# Patient Record
Sex: Male | Born: 1990 | Race: Black or African American | Hispanic: No | Marital: Single | Smoking: Current some day smoker
Health system: Southern US, Community
[De-identification: ages and names within clinical notes are randomized; demographics above are authoritative.]

---
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
## 2013-12-29 ENCOUNTER — Emergency Department: Payer: Self-pay | Admitting: Internal Medicine

## 2014-08-29 ENCOUNTER — Emergency Department: Payer: Self-pay | Admitting: Emergency Medicine

## 2015-05-22 ENCOUNTER — Encounter (HOSPITAL_COMMUNITY): Payer: Self-pay | Admitting: *Deleted

## 2015-05-22 ENCOUNTER — Emergency Department (HOSPITAL_COMMUNITY)
Admission: EM | Admit: 2015-05-22 | Discharge: 2015-05-22 | Disposition: A | Discharge status: Home / Self Care | Payer: Self-pay | Attending: Emergency Medicine | Admitting: Emergency Medicine

## 2015-05-22 DIAGNOSIS — F17210 Nicotine dependence, cigarettes, uncomplicated: Secondary | ICD-10-CM | POA: Insufficient documentation

## 2015-05-22 DIAGNOSIS — K0889 Other specified disorders of teeth and supporting structures: Secondary | ICD-10-CM | POA: Insufficient documentation

## 2015-05-22 DIAGNOSIS — K08109 Complete loss of teeth, unspecified cause, unspecified class: Secondary | ICD-10-CM | POA: Insufficient documentation

## 2015-05-22 DIAGNOSIS — K029 Dental caries, unspecified: Secondary | ICD-10-CM | POA: Insufficient documentation

## 2015-05-22 MED ORDER — PENICILLIN V POTASSIUM 500 MG PO TABS: 500.0000 mg | ORAL_TABLET | Freq: Four times a day (QID) | ORAL | Status: AC

## 2015-05-22 MED ORDER — IBUPROFEN 800 MG PO TABS: 800.0000 mg | ORAL_TABLET | Freq: Three times a day (TID) | ORAL | Status: DC

## 2015-05-22 MED ORDER — IBUPROFEN 400 MG PO TABS
800.0000 mg | ORAL_TABLET | Freq: Once | ORAL | Status: AC
  Administered 2015-05-22: 800 mg via ORAL
  Filled 2015-05-22: qty 2

## 2015-05-22 NOTE — ED Notes (Signed)
Pt reports oral swelling started 2 days ago. PT reports he has a broken tooth at site .

## 2015-05-22 NOTE — ED Notes (Signed)
Declined W/C at D/C and was escorted to lobby by RN. 

## 2015-05-22 NOTE — Discharge Instructions (Signed)
Dental Pain    Dental pain may be caused by many things, including:  Tooth decay (cavities or caries). Cavities expose the nerve of your tooth to air and hot or cold temperatures. This can cause pain or discomfort.  Abscess or infection. A dental abscess is a collection of infected pus from a bacterial infection in the inner part of the tooth (pulp). It usually occurs at the end of the tooth's root.  Injury.  An unknown reason (idiopathic). Your pain may be mild or severe. It may only occur when:  You are chewing.  You are exposed to hot or cold temperature.  You are eating or drinking sugary foods or beverages, such as soda or candy. Your pain may also be constant.  HOME CARE INSTRUCTIONS  Watch your dental pain for any changes. The following actions may help to lessen any discomfort that you are feeling:  Take medicines only as directed by your dentist.  If you were prescribed an antibiotic medicine, finish all of it even if you start to feel better.  Keep all follow-up visits as directed by your dentist. This is important.  Do not apply heat to the outside of your face.  Rinse your mouth or gargle with salt water if directed by your dentist. This helps with pain and swelling.  You can make salt water by adding  tsp of salt to 1 cup of warm water. Apply ice to the painful area of your face:  Put ice in a plastic bag.  Place a towel between your skin and the bag.  Leave the ice on for 20 minutes, 2-3 times per day. Avoid foods or drinks that cause you pain, such as:  Very hot or very cold foods or drinks.  Sweet or sugary foods or drinks. SEEK MEDICAL CARE IF:  Your pain is not controlled with medicines.  Your symptoms are worse.  You have new symptoms. SEEK IMMEDIATE MEDICAL CARE IF:  You are unable to open your mouth.  You are having trouble breathing or swallowing.  You have a fever.  Your face, neck, or jaw is swollen. This information is not intended to replace advice  given to you by your health care provider. Make sure you discuss any questions you have with your health care provider.  Document Released: 05/27/2005 Document Revised: 10/11/2014 Document Reviewed: 05/23/2014  Elsevier Interactive Patient Education 2016 Elsevier Inc.  Dental Abscess    A dental abscess is a collection of pus in or around a tooth.  CAUSES  This condition is caused by a bacterial infection around the root of the tooth that involves the inner part of the tooth (pulp). It may result from:  Severe tooth decay.  Trauma to the tooth that allows bacteria to enter into the pulp, such as a broken or chipped tooth.  Severe gum disease around a tooth. SYMPTOMS  Symptoms of this condition include:  Severe pain in and around the infected tooth.  Swelling and redness around the infected tooth, in the mouth, or in the face.  Tenderness.  Pus drainage.  Bad breath.  Bitter taste in the mouth.  Difficulty swallowing.  Difficulty opening the mouth.  Nausea.  Vomiting.  Chills.  Swollen neck glands.  Fever. DIAGNOSIS  This condition is diagnosed with examination of the infected tooth. During the exam, your dentist may tap on the infected tooth. Your dentist will also ask about your medical and dental history and may order X-rays.  TREATMENT  This condition is  treated by eliminating the infection. This may be done with:  Antibiotic medicine.  A root canal. This may be performed to save the tooth.  Pulling (extracting) the tooth. This may also involve draining the abscess. This is done if the tooth cannot be saved. HOME CARE INSTRUCTIONS  Take medicines only as directed by your dentist.  If you were prescribed antibiotic medicine, finish all of it even if you start to feel better.  Rinse your mouth (gargle) often with salt water to relieve pain or swelling.  Do not drive or operate heavy machinery while taking pain medicine.  Do not apply heat to the outside of your mouth.  Keep  all follow-up visits as directed by your dentist. This is important. SEEK MEDICAL CARE IF:  Your pain is worse and is not helped by medicine. SEEK IMMEDIATE MEDICAL CARE IF:  You have a fever or chills.  Your symptoms suddenly get worse.  You have a very bad headache.  You have problems breathing or swallowing.  You have trouble opening your mouth.  You have swelling in your neck or around your eye. This information is not intended to replace advice given to you by your health care provider. Make sure you discuss any questions you have with your health care provider.  Document Released: 05/27/2005 Document Revised: 10/11/2014 Document Reviewed: 05/24/2014  Elsevier Interactive Patient Education 2016 [employer]   Emergency Department Resource Guide 1) Find a Doctor and Pay Out of Pocket Although you won't have to find out who is covered by your insurance plan, it is a good idea to ask around and get recommendations. You will then need to call the office and see if the doctor you have chosen will accept you as a new patient and what types of options they offer for patients who are self-pay. Some doctors offer discounts or will set up payment plans for their patients who do not have insurance, but you will need to ask so you aren't surprised when you get to your appointment.  2) Contact Your Local Health Department Not all health departments have doctors that can see patients for sick visits, but many do, so it is worth a call to see if yours does. If you don't know where your local health department is, you can check in your phone book. The CDC also has a tool to help you locate your state's health department, and many state websites also have listings of all of their local health departments.  3) Find a Walk-in Clinic If your illness is not likely to be very severe or complicated, you may want to try a walk in clinic. These are popping up all over the country in pharmacies, drugstores, and  shopping centers. They're usually staffed by nurse practitioners or physician assistants that have been trained to treat common illnesses and complaints. They're usually fairly quick and inexpensive. However, if you have serious medical issues or chronic medical problems, these are probably not your best option.  No Primary Care Doctor: - Call Health Connect at  [phone] - they can help you locate a primary care doctor that  accepts your insurance, provides certain services, etc. - Physician Referral Service- [phone]  Chronic Pain Problems: Organization         Address  Phone   Notes  [facility]  [phone]) [phone] Patients need to be referred by their primary care doctor.   Medication Assistance: Organization         Address  Phone   Notes  [facility] Medication [facility] [address] [employer] [place] [place], [place] [address] [phone] --Must be a resident of [facility] -- Must have NO insurance coverage whatsoever (no Medicaid/ Medicare, etc.) -- The pt. MUST have a primary care doctor that directs their care regularly and follows them in the community   [facility]  [phone]   [employer]  [phone]    Agencies that provide inexpensive medical care: Organization         Address  Phone   Notes  [facility]  [phone]   [facility]    [phone]   [facility] [address] [place], [place] [address] [phone]   [facility] [address], [place] [phone]   Planned Parenthood    [phone]   [facility]    [phone]   [facility]  [address], [place] Phone:  [phone], Fax:  [phone] Hours of Operation:  9 am - 6 pm, M-F.  Also accepts Medicaid/Medicare and self-pay.  [facility]  [address], [place] Phone: [phone],  Fax: [phone]. Hours of Operation:  8:30 am - 5:30 pm, M-F.  Also accepts Medicaid and self-pay.  [facility] [place] [address] [place] Phone: [phone]   [facility] [address] Natasha Bence [place], [place] [phone], Ext. [phone] Mondays & Thursdays: 7-9 AM.  First 15 patients are seen on a first come, first serve basis.    Medicaid-accepting [facility] Providers:  Organization         Address  Phone   Notes  [facility] [address], [place] [phone] Also accepts self-pay patients.  [facility] [address] Laurell Josephs [place], [place]  [phone]   [facility] [address], [place] [phone]   [facility] [address] [place] [phone]   Renaye Rakers [address], [place]   [phone] Only accepts [place] Access [place] patients after they have their name applied to their card.   Self-Pay (no insurance) in [facility]:  Organization         Address  Phone   Notes  Sickle Cell Patients, [facility] [address] [place], [place] [phone]   [facility] [address] [place], [place] [phone]   [facility] [place]  [address], [place] [phone]   [facility]/Dr. Osei-Bonsu  [address], [place] or [address], [place] [phone] Phone number for both [place] and [place] locations is the same.  [facility] [address], [place] [phone]   [facility] [address], [place] or [address] [phone]   [facility] [address] [place] [phone], phone; [phone], fax Sees patients 1st and 3rd Saturday of every month.  Must not qualify for public or private  insurance (i.e. Medicaid, Medicare,  Health Choice, Veterans' Benefits)  Household income should be no more than 200% of the poverty level The clinic cannot treat you if you are pregnant or think you are pregnant  Sexually transmitted diseases are not treated at the clinic.    Dental Care: Organization         Address  Phone  Notes  [facility] of [facility] [address] [place], [place] [phone] Accepts children up to age 65 who are enrolled in [place] or [place] Health Choice; pregnant women with a Medicaid card; and children who have applied for Medicaid or [place] Health Choice, but were declined, whose parents can pay a reduced fee at time of service.  [facility] Department of [facility]  [address], [place] [phone] Accepts children up to age 7 who are enrolled in [place] or [place] Health Choice; pregnant women with a Medicaid card; and children who have applied for Medicaid or [place] Health Choice, but were declined, whose parents can pay a reduced fee at time of service.  [facility]  [address] [place], [place] [phone] Patients are seen by appointment only. Walk-ins are not accepted. [facility] will see patients 55 years of age and older. Monday - Tuesday (8am-5pm) Most Wednesdays (8:30-5pm) $30 per visit, cash only  [facility] Adult Dental Access PROGRAM  [address], [facility] [phone] Patients are seen by appointment only. Walk-ins are not accepted. [facility] will see patients 39 years of age and older. One Wednesday Evening (Monthly: Volunteer Based).  $30 per visit, cash only  [employer]  [phone] for adults; Children under age 60, call Graduate Pediatric Dentistry at [phone]. Children aged 59-14, please call [phone] to request a pediatric application.  Dental services are provided in all areas of dental care  including fillings, crowns and bridges, complete and partial dentures, implants, gum treatment, root canals, and extractions. Preventive care is also provided. Treatment is provided to both adults and children. Patients are selected via a lottery and there is often a waiting list.   [facility] [address] [place]  [phone] [email or web address]   [facility] [address] [place], [place] [phone], Ext. [phone] Second and Fourth Thursday of each month, opens at 6:30 AM; Clinic ends at 9 AM.  Patients are seen on a first-come first-served basis, and a limited number are seen during each clinic.   [facility]  [address] Ether Griffins [place], [place] [phone]   Eligibility Requirements You must have lived in [place], [place], or [place] counties for at least the last three months.   You cannot be eligible for state or federal sponsored [place], including [employer], [place], or [employer].   You generally cannot be eligible for healthcare insurance through your employer.    How to apply: Eligibility screenings are held every Tuesday and Wednesday afternoon from 1:00 pm until 4:00 pm. You do not need an appointment for the interview!  [facility] [address] [place], [place] [phone]   [facility] Health Department  [phone]   [facility] Health Department  [phone]   [facility] Health Department  [phone]    Behavioral Health Resources in the Community: Intensive Outpatient Programs Organization         Address  Phone  Notes  [facility] Services [address], [place], [place] [phone]   [facility] Outpatient [address], [place], [place] [phone]   [facility] [address] [place], [place]  [phone]   [facility] Mental Health [address],  [place], [place] [phone] or [phone]     Substance Abuse Resources Organization         Address  Phone  Notes  [facility]  [phone]   [facility]  [phone]   [facility]  [phone]   Floydene Flock  [phone]   Residential & Outpatient Substance Abuse Program  [phone]   Psychological Services Organization         Address  Phone  Notes  [facility]  [phone]   [facility]  [phone]   [facility] Mental Health [address] [place] [phone] or [phone]    Mobile Crisis Teams Organization         Address  Phone  Notes  [facility], Mobile Crisis Care Unit  [phone]   [facility]  [address] [place], [place] [phone]   Doristine Locks [address] [place] [phone]    Self-Help/Support Groups Organization         Address  Phone             Notes  [facility] [place] - variety of support groups  336- I7437963 Call for more information  Narcotics Anonymous (NA), [facility] [address], [employer] [place]  2 meetings at this location   Statistician         Address  Phone  Notes  [facility] [address],    [place]  [phone]   [facility] [address] [place] [address], [place], [place] [phone]   [facility] [address] [place], [place] [phone] Admissions: 8am-3pm M-F  [facility] [address]    [place], [place] [phone]   [facility] [address] [place], [place], [place] [phone]   [facility] [address],  [place], [place] [phone]   [facility] - Intensive Outpatient [address] Laurell Josephs [address], [place], [place] [phone]   [facility] ([facility] [address] [place].,  [place], [place] [phone] or [phone]   [facility]  Services ([facility]) [address] [place], [place] [phone] Accepts Medicaid  [facility] [address]  [place] [phone] Substance Abuse/Addiction Treatment   [facility] Organization         Address  Phone  Notes  [facility]  [phone]   Angie Fava, PhD [address] Ervin Knack [place], [place]   [phone] or [phone]   [facility] Behavioral   [address] [place], [place] [phone]   [facility] [address] [place], [place] [phone] Insurance/Medicaid/sponsorship through [facility] and Families [address]                                    [place], [place] [phone] Therapy/tele-psych/case  [facility] [address], [place] [phone]    Dr. Lolly Mustache  [phone]   [facility]  [employer] [facility] Dept. 1) [address], [place] 2) [address], [place] 3)  [address], [place] [phone]  [phone]   [facility] Child Abuse Hotline (410)886-4694 or (  336) F6897951 (After Hours)

## 2015-05-22 NOTE — ED Provider Notes (Signed)
CSN: [id number]     Arrival date & time 05/22/15  0810 History  By signing my name below, I, Martin Valentine, attest that this documentation has been prepared under the direction and in the presence of non-physician practitioner, Cheri Fowler, PA-C. Electronically Signed: Freida Valentine, Scribe. 05/22/2015. 9:30 AM.    Chief Complaint  Patient presents with  . Oral Swelling   The history is provided by the patient. No language interpreter was used.     HPI Comments:  Martin Valentine is a 24 y.o. male who presents to the Emergency Department complaining of mild-moderate swelling left lower gumline that has progressively worsened since onset ~ 3 days ago. Pt notes a tooth in the area is broken. He has taken tylenol PM for pain without relief. He deneis fever, N/V, trouble swallowing, tongue swelling, drooling, and neck stiffness. No alleviating factors noted.  No dentist  History reviewed. No pertinent past medical history. History reviewed. No pertinent past surgical history. History reviewed. No pertinent family history. Social History  Substance Use Topics  . Smoking status: Current Some Day Smoker -- 0.50 packs/day    Types: Cigarettes  . Smokeless tobacco: Never Used  . Alcohol Use: No    Review of Systems  Constitutional: Negative for fever and chills.  HENT: Positive for dental problem. Negative for sore throat and trouble swallowing.   Respiratory: Negative for shortness of breath.   Cardiovascular: Negative for chest pain.  All other systems reviewed and are negative.   Allergies  Review of patient's allergies indicates no known allergies.  Home Medications   Prior to Admission medications   Medication Sig Start Date End Date Taking? Authorizing Provider  ibuprofen (ADVIL,MOTRIN) 800 MG tablet Take 1 tablet (800 mg total) by mouth 3 (three) times daily. 05/22/15   Cheri Fowler, PA-C  penicillin v potassium (VEETID) 500 MG tablet Take 1 tablet (500 mg total) by mouth 4  (four) times daily. 05/22/15 05/29/15  Minervia Osso, PA-C   BP 153/87 mmHg  Pulse 82  Temp(Src) 98.5 F (36.9 C) (Oral)  Resp 16  SpO2 100% Physical Exam  Constitutional: He is oriented to person, place, and time. He appears well-developed and well-nourished. No distress.  HENT:  Head: Normocephalic and atraumatic.  Right Ear: External ear normal.  Left Ear: External ear normal.  Mouth/Throat: Uvula is midline, oropharynx is clear and moist and mucous membranes are normal. No oral lesions. No trismus in the jaw. Abnormal dentition. Dental caries present. No uvula swelling.    No facial swelling. Patient tolerating secretions without difficulty.  Eyes: Conjunctivae are normal.  Neck: Normal range of motion. Neck supple.  No signs of Ludwig angina.  Cardiovascular: Normal rate.   Pulmonary/Chest: Effort normal.  Abdominal: He exhibits no distension.  Lymphadenopathy:    He has no cervical adenopathy.  Neurological: He is alert and oriented to person, place, and time.  Skin: Skin is warm and dry.  Psychiatric: He has a normal mood and affect.  Nursing note and vitals reviewed.   ED Course  Procedures   DIAGNOSTIC STUDIES:  Oxygen Saturation is 100% on RA, normal by my interpretation.    COORDINATION OF CARE:  9:28 AM Discussed treatment plan with pt at bedside and pt agreed to plan.    MDM   Final diagnoses:  Pain, dental    Suspect dental pain associated with dental infection and possible dental abscess with patient afebrile, non toxic appearing and swallowing secretions well. I gave patient referral to  dentist and stressed the importance of dental follow up for ultimate management of dental pain. Discussed return precautions.  Patient expresses understanding and agrees with plan.  I will also give penicillin VK and pain control.    I personally performed the services described in this documentation, which was scribed in my presence. The recorded information has been  reviewed and is accurate.    Cheri FowlerKayla Roslynn Holte, PA-C 05/22/15 [id number]  Mancel BaleElliott [place], MD 05/22/15 [id number]

## 2016-09-10 ENCOUNTER — Encounter: Payer: Self-pay | Admitting: Emergency Medicine

## 2016-09-10 ENCOUNTER — Emergency Department: Payer: Self-pay

## 2016-09-10 ENCOUNTER — Emergency Department
Admission: EM | Admit: 2016-09-10 | Discharge: 2016-09-10 | Disposition: A | Discharge status: Home / Self Care | Payer: Self-pay | Attending: Emergency Medicine | Admitting: Emergency Medicine

## 2016-09-10 DIAGNOSIS — S62304A Unspecified fracture of fourth metacarpal bone, right hand, initial encounter for closed fracture: Secondary | ICD-10-CM | POA: Insufficient documentation

## 2016-09-10 DIAGNOSIS — F17210 Nicotine dependence, cigarettes, uncomplicated: Secondary | ICD-10-CM | POA: Insufficient documentation

## 2016-09-10 DIAGNOSIS — S62201A Unspecified fracture of first metacarpal bone, right hand, initial encounter for closed fracture: Secondary | ICD-10-CM

## 2016-09-10 DIAGNOSIS — W2201XA Walked into wall, initial encounter: Secondary | ICD-10-CM | POA: Insufficient documentation

## 2016-09-10 DIAGNOSIS — Y929 Unspecified place or not applicable: Secondary | ICD-10-CM | POA: Insufficient documentation

## 2016-09-10 DIAGNOSIS — Y939 Activity, unspecified: Secondary | ICD-10-CM | POA: Insufficient documentation

## 2016-09-10 DIAGNOSIS — Y999 Unspecified external cause status: Secondary | ICD-10-CM | POA: Insufficient documentation

## 2016-09-10 MED ORDER — OXYCODONE-ACETAMINOPHEN 5-325 MG PO TABS: 1.0000 | ORAL_TABLET | Freq: Four times a day (QID) | ORAL | 0 refills | Status: DC | PRN

## 2016-09-10 MED ORDER — NAPROXEN SODIUM 275 MG PO TABS: 275.0000 mg | ORAL_TABLET | Freq: Two times a day (BID) | ORAL | 2 refills | Status: AC

## 2016-09-10 MED ORDER — NAPROXEN 500 MG PO TABS
500.0000 mg | ORAL_TABLET | Freq: Once | ORAL | Status: AC
  Administered 2016-09-10: 500 mg via ORAL
  Filled 2016-09-10: qty 1

## 2016-09-10 NOTE — ED Provider Notes (Signed)
[facility] Emergency Department Provider Note   ____________________________________________   First MD Initiated Contact with Patient 09/10/16 1456     (approximate)  I have reviewed the triage vital signs and the nursing notes.   HISTORY  Chief Complaint Hand Pain    HPI PAGE Martin Valentine is a 26 y.o. male patient complaining of right hand pain secondary up which a wall today. Patient denies loss of sensation. Patient stated pain increases with movement of the hand. Patient rates the pain as 7/10. Patient described  pain as "achy".No palliative measures for complaint. Patient is right-hand dominant.   History reviewed. No pertinent past medical history.  There are no active problems to display for this patient.   History reviewed. No pertinent surgical history.  Prior to Admission medications   Medication Sig Start Date End Date Taking? Authorizing Provider  ibuprofen (ADVIL,MOTRIN) 800 MG tablet Take 1 tablet (800 mg total) by mouth 3 (three) times daily. 05/22/15   Cheri Fowler, PA-C  naproxen sodium (ANAPROX) 275 MG tablet Take 1 tablet (275 mg total) by mouth 2 (two) times daily with a meal. 09/10/16 09/10/17  Joni Reining, PA-C  oxyCODONE-acetaminophen (ROXICET) 5-325 MG tablet Take 1 tablet by mouth every 6 (six) hours as needed for moderate pain. 09/10/16   Joni Reining, PA-C    Allergies Patient has no known allergies.  History reviewed. No pertinent family history.  Social History Social History  Substance Use Topics  . Smoking status: Current Some Day Smoker    Packs/day: 0.50    Types: Cigarettes  . Smokeless tobacco: Never Used  . Alcohol use No    Review of Systems Constitutional: No fever/chills Eyes: No visual changes. ENT: No sore throat. Cardiovascular: Denies chest pain. Respiratory: Denies shortness of breath. Gastrointestinal: No abdominal pain.  No nausea, no vomiting.  No diarrhea.  No constipation. Genitourinary:  Negative for dysuria. Musculoskeletal: Right hand pain and edema Skin: Negative for rash. Neurological: Negative for headaches, focal weakness or numbness.  ____________________________________________   PHYSICAL EXAM:  VITAL SIGNS: ED Triage Vitals   Enc Vitals Group     BP (!) 130/107     Pulse Rate 86     Resp 16     Temp 98.6 F (37 C)     Temp Source Oral     SpO2 99 %     Weight 225 lb (102.1 kg)     Height 6' (1.829 m)     Head Circumference      Peak Flow      Pain Score 7     Pain Loc      Pain Edu?      Excl. in GC?     Constitutional: Alert and oriented. Well appearing and in no acute distress. Eyes: Conjunctivae are normal. PERRL. EOMI. Head: Atraumatic. Nose: No congestion/rhinnorhea. Mouth/Throat: Mucous membranes are moist.  Oropharynx non-erythematous. Neck: No stridor.  No cervical spine tenderness to palpation. Hematological/Lymphatic/Immunilogical: No cervical lymphadenopathy. Cardiovascular: Normal rate, regular rhythm. Grossly normal heart sounds.  Good peripheral circulation. Respiratory: Normal respiratory effort.  No retractions. Lungs CTAB. Gastrointestinal: Soft and nontender. No distention. No abdominal bruits. No CVA tenderness. Musculoskeletal: No lower extremity tenderness nor edema.  No joint effusions. Neurologic:  Normal speech and language. No gross focal neurologic deficits are appreciated. No gait instability. Skin:  Skin is warm, dry and intact. No rash noted. Psychiatric: Mood and affect are normal. Speech and behavior are normal.  ____________________________________________  LABS (all labs ordered are listed, but only abnormal results are displayed)  Labs Reviewed - No data to display ____________________________________________  EKG   ____________________________________________  RADIOLOGY  4th metacarpal actual right hand ____________________________________________   PROCEDURES  Procedure(s) performed:  None  Procedures  Critical Care performed: No  ____________________________________________   INITIAL IMPRESSION / ASSESSMENT AND PLAN / ED COURSE  Pertinent labs & imaging results that were available during my care of the patient were reviewed by me and considered in my medical decision making (see chart for details).  Pain and edema suspected fracture to the right hand. X-ray pending.      ____________________________________________   FINAL CLINICAL IMPRESSION(S) / ED DIAGNOSES  Final diagnoses:  Closed nondisplaced fracture of first metacarpal bone of right hand, unspecified portion of metacarpal, initial encounter      NEW MEDICATIONS STARTED DURING THIS VISIT:  New Prescriptions   NAPROXEN SODIUM (ANAPROX) 275 MG TABLET    Take 1 tablet (275 mg total) by mouth 2 (two) times daily with a meal.   OXYCODONE-ACETAMINOPHEN (ROXICET) 5-325 MG TABLET    Take 1 tablet by mouth every 6 (six) hours as needed for moderate pain.     Note:  This document was prepared using Dragon voice recognition software and may include unintentional dictation errors.    Joni Reining, PA-C 09/10/16 1604    Emily Filbert, MD 09/11/16 [phone]

## 2016-09-10 NOTE — ED Notes (Signed)
Pt discharged home after verbalizing understanding of discharge instructions; nad noted. 

## 2016-09-10 NOTE — ED Notes (Signed)
States he hit a wall this am   Right hand swollen and tender   Positive pulses

## 2016-09-10 NOTE — ED Triage Notes (Signed)
Pt to ed with c/o right hand pain after punching a wall today.  Pt with noted swelling to right hand. +pulse in wrist, +movement and sensation.

## 2017-04-09 ENCOUNTER — Emergency Department: Payer: Self-pay

## 2017-04-09 ENCOUNTER — Emergency Department
Admission: EM | Admit: 2017-04-09 | Discharge: 2017-04-09 | Disposition: A | Discharge status: Home / Self Care | Payer: Self-pay | Attending: Student in an Organized Health Care Education/Training Program | Admitting: Student in an Organized Health Care Education/Training Program

## 2017-04-09 DIAGNOSIS — Y990 Civilian activity done for income or pay: Secondary | ICD-10-CM | POA: Insufficient documentation

## 2017-04-09 DIAGNOSIS — Y929 Unspecified place or not applicable: Secondary | ICD-10-CM | POA: Insufficient documentation

## 2017-04-09 DIAGNOSIS — Y9389 Activity, other specified: Secondary | ICD-10-CM | POA: Insufficient documentation

## 2017-04-09 DIAGNOSIS — S6991XA Unspecified injury of right wrist, hand and finger(s), initial encounter: Secondary | ICD-10-CM | POA: Insufficient documentation

## 2017-04-09 DIAGNOSIS — X509XXA Other and unspecified overexertion or strenuous movements or postures, initial encounter: Secondary | ICD-10-CM | POA: Insufficient documentation

## 2017-04-09 MED ORDER — OXYCODONE-ACETAMINOPHEN 5-325 MG PO TABS
1.0000 | ORAL_TABLET | Freq: Once | ORAL | Status: AC
  Administered 2017-04-09: 1 via ORAL
  Filled 2017-04-09: qty 1

## 2017-04-09 MED ORDER — ONDANSETRON 8 MG PO TBDP
8.0000 mg | ORAL_TABLET | Freq: Once | ORAL | Status: AC
  Administered 2017-04-09: 8 mg via ORAL
  Filled 2017-04-09: qty 1

## 2017-04-09 MED ORDER — MELOXICAM 15 MG PO TABS: 15.0000 mg | ORAL_TABLET | Freq: Every day | ORAL | 0 refills | Status: DC

## 2017-04-09 NOTE — ED Notes (Signed)
Ice pack given

## 2017-04-09 NOTE — ED Triage Notes (Signed)
Pt in with co right hand pain and swelling states he injured it at work but he fx hand 5 months ago and never followed up. He wore splint for only a week and never saw ortho.

## 2017-04-09 NOTE — ED Provider Notes (Signed)
[facility] Emergency Department Provider Note  ____________________________________________  Time seen: Approximately 8:42 PM  I have reviewed the triage vital signs and the nursing notes.   HISTORY  Chief Complaint Hand Injury    HPI Martin Valentine is a 26 y.o. male who presents emergency department complaining of right hand pain.  Patient reports that he had a boxer's fracture 5 months ago.  He wore his splint but did not follow-up with orthopedics.  He has had no residual problems.  Today at work, he was trying to put down a wooden truss when it fell, bending his hand backwards.  Patient reports he had immediate pain return to the lateral aspect of his hand with significant swelling.  Patient is able to move all digits of his hand appropriately.  No numbness or tingling.  No medications prior to arrival.  No other injury or complaint at this time.  No past medical history on file.  There are no active problems to display for this patient.   No past surgical history on file.  Prior to Admission medications   Medication Sig Start Date End Date Taking? Authorizing Provider  ibuprofen (ADVIL,MOTRIN) 800 MG tablet Take 1 tablet (800 mg total) by mouth 3 (three) times daily. 05/22/15   Cheri Fowler, PA-C  meloxicam (MOBIC) 15 MG tablet Take 1 tablet (15 mg total) by mouth daily. 04/09/17   Cuthriell, Delorise Royals, PA-C  naproxen sodium (ANAPROX) 275 MG tablet Take 1 tablet (275 mg total) by mouth 2 (two) times daily with a meal. 09/10/16 09/10/17  Joni Reining, PA-C  oxyCODONE-acetaminophen (ROXICET) 5-325 MG tablet Take 1 tablet by mouth every 6 (six) hours as needed for moderate pain. 09/10/16   Joni Reining, PA-C    Allergies Patient has no known allergies.  No family history on file.  Social History Social History  Substance Use Topics  . Smoking status: Current Some Day Smoker    Packs/day: 0.50    Types: Cigarettes  . Smokeless tobacco: Never Used   . Alcohol use No     Review of Systems  Constitutional: No fever/chills Eyes: No visual changes.  Cardiovascular: no chest pain. Respiratory: no cough. No SOB. Gastrointestinal: No abdominal pain.  No nausea, no vomiting.   Musculoskeletal: Positive for right hand pain Skin: Negative for rash, abrasions, lacerations, ecchymosis. Neurological: Negative for headaches, focal weakness or numbness. 10-point ROS otherwise negative.  ____________________________________________   PHYSICAL EXAM:  VITAL SIGNS: ED Triage Vitals  Enc Vitals Group     BP 04/09/17 2038 123/67     Pulse Rate 04/09/17 2038 66     Resp 04/09/17 2038 16     Temp 04/09/17 2034 98.4 F (36.9 C)     Temp Source 04/09/17 2038 Oral     SpO2 04/09/17 2038 100 %     Weight 04/09/17 2039 220 lb (99.8 kg)     Height 04/09/17 2039 6' (1.829 m)     Head Circumference --      Peak Flow --      Pain Score 04/09/17 2034 6     Pain Loc --      Pain Edu? --      Excl. in GC? --      Constitutional: Alert and oriented. Well appearing and in no acute distress. Eyes: Conjunctivae are normal. PERRL. EOMI. Head: Atraumatic. Neck: No stridor.    Cardiovascular: Normal rate, regular rhythm. Normal S1 and S2.  Good peripheral circulation. Respiratory:  Normal respiratory effort without tachypnea or retractions. Lungs CTAB. Good air entry to the bases with no decreased or absent breath sounds. Musculoskeletal: Full range of motion to all extremities.  Patient has apparent deformity to the dorsal aspect of the hand..  Patient does have significant swelling to the dorsal aspect of the right hand.  Patient is very tender to palpation along the fourth and fifth metacarpal bones.  Palpable abnormality noticed at the base of the fourth and fifth metacarpal.  Full range of motion all 5 digits.  Sensation and cap refill intact all 5 digits.   Neurologic:  Normal speech and language. No gross focal neurologic deficits are appreciated.   Skin:  Skin is warm, dry and intact. No rash noted. Psychiatric: Mood and affect are normal. Speech and behavior are normal. Patient exhibits appropriate insight and judgement.   ____________________________________________   LABS (all labs ordered are listed, but only abnormal results are displayed)  Labs Reviewed - No data to display ____________________________________________  EKG   ____________________________________________  RADIOLOGY Festus Barren Cuthriell, personally viewed and evaluated these images (plain radiographs) as part of my medical decision making, as well as reviewing the written report by the radiologist.  Dg Hand Complete Right  Result Date: 04/09/2017 CLINICAL DATA:  Right hand pain and swelling. Injured at work. Fracture 5 months ago. EXAM: RIGHT HAND - COMPLETE 3+ VIEW COMPARISON:  09/10/2016, 07/08/2010 FINDINGS: Again demonstrated is chronic deformity of the proximal fourth and fifth metacarpals with dorsal displacement of the metacarpals relative to the hamate. Very similar appearance to the study of 09/10/2016. Findings presumably related to un treated fracture dislocation. Additionally, there appears to be some flexion deformity of the DIP joint of the small finger, possibly due to previous extensor tendon injury. IMPRESSION: Findings again consistent with untreated fracture dislocations of the proximal fourth and fifth metacarpals. Apparent flexion deformity of the DIP joint of the small finger, presumed secondary to previous extensor tendon injury. Electronically Signed   By: Paulina Fusi M.D.   On: 04/09/2017 20:57    ____________________________________________    PROCEDURES  Procedure(s) performed:    .Splint Application Date/Time: 04/09/2017 9:41 PM Performed by: Gala Romney D Authorized by: Gala Romney D   Consent:    Consent obtained:  Verbal   Consent given by:  Patient   Risks discussed:  Pain and  swelling Pre-procedure details:    Sensation:  Normal Procedure details:    Laterality:  Right   Location:  Hand   Hand:  R hand   Splint type:  Ulnar gutter   Supplies:  Cotton padding, Ortho-Glass and elastic bandage Post-procedure details:    Pain:  Unchanged   Sensation:  Normal   Patient tolerance of procedure:  Tolerated well, no immediate complications      Medications  oxyCODONE-acetaminophen (PERCOCET/ROXICET) 5-325 MG per tablet 1 tablet (1 tablet Oral Given 04/09/17 2101)  ondansetron (ZOFRAN-ODT) disintegrating tablet 8 mg (8 mg Oral Given 04/09/17 2101)     ____________________________________________   INITIAL IMPRESSION / ASSESSMENT AND PLAN / ED COURSE  Pertinent labs & imaging results that were available during my care of the patient were reviewed by me and considered in my medical decision making (see chart for details).  Review of the [place] CSRS was performed in accordance of the [facility] prior to dispensing any controlled drugs.     Patient's diagnosis is consistent with right hand injury.  Initial differential included fracture versus dislocation versus contusion versus sprain of the  right hand.  Patient did have a previous fourth metacarpal fracture with apparent dislocation of the metacarpal head.  Patient was initially placed in a splint and referred to orthopedics.  Patient did not follow-up.  Patient reports that he has had mild deformity to the dorsal aspect of the hand.  He has had no loss of range of motion to the wrist or any digit of the right hand.  Patient suffered an injury today.  X-ray reveals no significant change in appearance from previous x-ray 5 months ago.  At this time, patient is having pain to the same area.  I will not attempt reduction at this time as x-rays are consistent with x-rays 5 months prior.  I suspect that displacement of the metacarpal head is likely chronic.  However, due to patient's new injury, radiological findings, I will  refer him to hand surgery for further evaluation.  Patient's hand is splinted at this time, he is given meloxicam for symptom control..  Patient is given ED precautions to return to the ED for any worsening or new symptoms.     ____________________________________________  FINAL CLINICAL IMPRESSION(S) / ED DIAGNOSES  Final diagnoses:  Injury of right hand, initial encounter      NEW MEDICATIONS STARTED DURING THIS VISIT:  Discharge Medication List as of 04/09/2017  9:19 PM    START taking these medications   Details  meloxicam (MOBIC) 15 MG tablet Take 1 tablet (15 mg total) by mouth daily., Starting Wed 04/09/2017, Print            This chart was dictated using voice recognition software/Dragon. Despite best efforts to proofread, errors can occur which can change the meaning. Any change was purely unintentional.    Racheal PatchesCuthriell, Jonathan D, PA-C 04/09/17 2142    Willy Eddyobinson, Patrick, MD 04/09/17 [phone]

## 2017-07-03 ENCOUNTER — Emergency Department
Admission: EM | Admit: 2017-07-03 | Discharge: 2017-07-03 | Disposition: A | Discharge status: Home / Self Care | Payer: Self-pay | Attending: Emergency Medicine | Admitting: Emergency Medicine

## 2017-07-03 ENCOUNTER — Encounter: Payer: Self-pay | Admitting: Emergency Medicine

## 2017-07-03 DIAGNOSIS — F17210 Nicotine dependence, cigarettes, uncomplicated: Secondary | ICD-10-CM | POA: Insufficient documentation

## 2017-07-03 DIAGNOSIS — R1084 Generalized abdominal pain: Secondary | ICD-10-CM | POA: Insufficient documentation

## 2017-07-03 DIAGNOSIS — R112 Nausea with vomiting, unspecified: Secondary | ICD-10-CM | POA: Insufficient documentation

## 2017-07-03 LAB — URINALYSIS, COMPLETE (UACMP) WITH MICROSCOPIC
Bacteria, UA: NONE SEEN
Bilirubin Urine: NEGATIVE
Glucose, UA: NEGATIVE mg/dL
Hgb urine dipstick: NEGATIVE
Ketones, ur: NEGATIVE mg/dL
Leukocytes, UA: NEGATIVE
Nitrite: NEGATIVE
PH: 6 (ref 5.0–8.0)
Protein, ur: NEGATIVE mg/dL
SPECIFIC GRAVITY, URINE: 1.025 (ref 1.005–1.030)
SQUAMOUS EPITHELIAL / LPF: NONE SEEN

## 2017-07-03 LAB — COMPREHENSIVE METABOLIC PANEL
ALT: 33 U/L (ref 17–63)
AST: 44 U/L — AB (ref 15–41)
Albumin: 4.5 g/dL (ref 3.5–5.0)
Alkaline Phosphatase: 65 U/L (ref 38–126)
Anion gap: 8 (ref 5–15)
BILIRUBIN TOTAL: 0.8 mg/dL (ref 0.3–1.2)
BUN: 14 mg/dL (ref 6–20)
CHLORIDE: 104 mmol/L (ref 101–111)
CO2: 28 mmol/L (ref 22–32)
CREATININE: 0.94 mg/dL (ref 0.61–1.24)
Calcium: 9.3 mg/dL (ref 8.9–10.3)
GFR calc Af Amer: >60 mL/min (ref >60)
Glucose, Bld: 107 mg/dL — ABNORMAL HIGH (ref 65–99)
Potassium: 3.6 mmol/L (ref 3.5–5.1)
Sodium: 140 mmol/L (ref 135–145)
Total Protein: 7.9 g/dL (ref 6.5–8.1)

## 2017-07-03 LAB — CBC
HCT: 42.7 % (ref 40.0–52.0)
Hemoglobin: 14.4 g/dL (ref 13.0–18.0)
MCH: 31.5 pg (ref 26.0–34.0)
MCHC: 33.8 g/dL (ref 32.0–36.0)
MCV: 93.3 fL (ref 80.0–100.0)
PLATELETS: 200 10*3/uL (ref 150–440)
RBC: 4.58 MIL/uL (ref 4.40–5.90)
RDW: 13.3 % (ref 11.5–14.5)
WBC: 6.7 10*3/uL (ref 3.8–10.6)

## 2017-07-03 LAB — LIPASE, BLOOD: Lipase: 48 U/L (ref 11–51)

## 2017-07-03 MED ORDER — ONDANSETRON 4 MG PO TBDP: 4.0000 mg | ORAL_TABLET | Freq: Three times a day (TID) | ORAL | 0 refills | Status: DC | PRN

## 2017-07-03 MED ORDER — ONDANSETRON 4 MG PO TBDP
ORAL_TABLET | ORAL | Status: AC
  Administered 2017-07-03: 4 mg via ORAL
  Filled 2017-07-03: qty 1

## 2017-07-03 MED ORDER — ONDANSETRON 4 MG PO TBDP
4.0000 mg | ORAL_TABLET | Freq: Once | ORAL | Status: AC
  Administered 2017-07-03: 4 mg via ORAL

## 2017-07-03 NOTE — ED Triage Notes (Signed)
Pt comes into the ED via POV c/o abdominal aches and 2 episodes of nausea that started while he was at work.  Patient in NAD at this time and denies any other symptoms or complaints.  Patient ambulatory to triage.  Denies chest pain, shortness of breath, or dizziness.

## 2017-07-03 NOTE — ED Provider Notes (Signed)
[facility] Emergency Department Provider Note       Time seen: ----------------------------------------- 9:43 PM on 07/03/2017 -----------------------------------------   I have reviewed the triage vital signs and the nursing notes.  HISTORY   Chief Complaint Abdominal Pain and Emesis    HPI Jackie Plumaron T Durio is a [age] y.o. male with no significant past medical history who presents to the ED for abdominal aching and 2 episodes of nausea that started while he was at work.  He has not had any diarrhea, he denies history of this.  Currently his symptoms are resolved.  Nothing made it better.  It seemed to resolve spontaneously.  History reviewed. No pertinent past medical history.  There are no active problems to display for this patient.   No past surgical history on file.  Allergies Patient has no known allergies.  Social History Social History   Tobacco Use  . Smoking status: Current Some Day Smoker    Packs/day: 0.50    Types: Cigarettes  . Smokeless tobacco: Never Used  Substance Use Topics  . Alcohol use: No  . Drug use: No    Review of Systems Constitutional: Negative for fever. Cardiovascular: Negative for chest pain. Respiratory: Negative for shortness of breath. Gastrointestinal: Positive for abdominal pain, vomiting Musculoskeletal: Negative for back pain. Skin: Negative for rash. Neurological: Negative for headaches, focal weakness or numbness.  All systems negative/normal/unremarkable except as stated in the HPI  ____________________________________________   PHYSICAL EXAM:  VITAL SIGNS: ED Triage Vitals  Enc Vitals Group     BP 07/03/17 2116 (!) 145/82     Pulse Rate 07/03/17 2115 64     Resp 07/03/17 2115 14     Temp 07/03/17 2115 98.4 F (36.9 C)     Temp Source 07/03/17 2115 Oral     SpO2 07/03/17 2115 99 %     Weight 07/03/17 2115 225 lb (102.1 kg)     Height 07/03/17 2115 6' (1.829 m)     Head Circumference --       Peak Flow --      Pain Score 07/03/17 2115 5     Pain Loc --      Pain Edu? --      Excl. in GC? --     Constitutional: Alert and oriented. Well appearing and in no distress. Eyes: Conjunctivae are normal. Normal extraocular movements. Cardiovascular: Normal rate, regular rhythm. No murmurs, rubs, or gallops. Respiratory: Normal respiratory effort without tachypnea nor retractions. Breath sounds are clear and equal bilaterally. No wheezes/rales/rhonchi. Gastrointestinal: Soft and nontender. Normal bowel sounds Musculoskeletal: Nontender with normal range of motion in extremities. No lower extremity tenderness nor edema. Neurologic:  Normal speech and language. No gross focal neurologic deficits are appreciated.  Skin:  Skin is warm, dry and intact. No rash noted.  ____________________________________________  ED COURSE:  As part of my medical decision making, I reviewed the following data within the electronic MEDICAL RECORD NUMBER History obtained from family if available, nursing notes, old chart and ekg, as well as notes from prior ED visits. Patient presented for abdominal pain and vomiting, we will assess with labs and give oral antiemetics   Procedures ____________________________________________   LABS (pertinent positives/negatives)  Labs Reviewed  LIPASE, BLOOD  COMPREHENSIVE METABOLIC PANEL  CBC  URINALYSIS, COMPLETE (UACMP) WITH MICROSCOPIC  ___________________________________________  DIFFERENTIAL DIAGNOSIS   Gastroenteritis, gastritis, peptic ulcer disease, food poisoning  FINAL ASSESSMENT AND PLAN  Vomiting   Plan: Patient had presented for vomiting  and his symptoms have resolved spontaneously. Patient's labs were reassuring.  He did not require any imaging during this visit.  He is stable for outpatient follow-up.   Emily Filbert, MD   Note: This note was generated in part or whole with voice recognition software. Voice recognition is usually  quite accurate but there are transcription errors that can and very often do occur. I apologize for any typographical errors that were not detected and corrected.     Emily Filbert, MD 07/03/17 2145

## 2017-07-24 ENCOUNTER — Emergency Department
Admission: EM | Admit: 2017-07-24 | Discharge: 2017-07-24 | Disposition: A | Discharge status: Home / Self Care | Payer: Self-pay | Attending: Emergency Medicine | Admitting: Emergency Medicine

## 2017-07-24 ENCOUNTER — Other Ambulatory Visit: Payer: Self-pay

## 2017-07-24 ENCOUNTER — Encounter: Payer: Self-pay | Admitting: Emergency Medicine

## 2017-07-24 DIAGNOSIS — F17210 Nicotine dependence, cigarettes, uncomplicated: Secondary | ICD-10-CM | POA: Insufficient documentation

## 2017-07-24 DIAGNOSIS — T148XXA Other injury of unspecified body region, initial encounter: Secondary | ICD-10-CM

## 2017-07-24 DIAGNOSIS — Y939 Activity, unspecified: Secondary | ICD-10-CM | POA: Insufficient documentation

## 2017-07-24 DIAGNOSIS — M62830 Muscle spasm of back: Secondary | ICD-10-CM | POA: Insufficient documentation

## 2017-07-24 DIAGNOSIS — X500XXA Overexertion from strenuous movement or load, initial encounter: Secondary | ICD-10-CM | POA: Insufficient documentation

## 2017-07-24 DIAGNOSIS — Y999 Unspecified external cause status: Secondary | ICD-10-CM | POA: Insufficient documentation

## 2017-07-24 DIAGNOSIS — Z79899 Other long term (current) drug therapy: Secondary | ICD-10-CM | POA: Insufficient documentation

## 2017-07-24 DIAGNOSIS — Y929 Unspecified place or not applicable: Secondary | ICD-10-CM | POA: Insufficient documentation

## 2017-07-24 DIAGNOSIS — S39012A Strain of muscle, fascia and tendon of lower back, initial encounter: Secondary | ICD-10-CM | POA: Insufficient documentation

## 2017-07-24 MED ORDER — HYDROCODONE-ACETAMINOPHEN 5-325 MG PO TABS
1.0000 | ORAL_TABLET | Freq: Once | ORAL | Status: AC
  Administered 2017-07-24: 1 via ORAL
  Filled 2017-07-24: qty 1

## 2017-07-24 MED ORDER — IBUPROFEN 800 MG PO TABS: 800.0000 mg | ORAL_TABLET | Freq: Three times a day (TID) | ORAL | 0 refills | Status: DC | PRN

## 2017-07-24 MED ORDER — HYDROCODONE-ACETAMINOPHEN 5-325 MG PO TABS: 1.0000 | ORAL_TABLET | Freq: Four times a day (QID) | ORAL | 0 refills | Status: DC | PRN

## 2017-07-24 MED ORDER — CYCLOBENZAPRINE HCL 10 MG PO TABS
5.0000 mg | ORAL_TABLET | Freq: Once | ORAL | Status: AC
  Administered 2017-07-24: 5 mg via ORAL
  Filled 2017-07-24: qty 1

## 2017-07-24 MED ORDER — CYCLOBENZAPRINE HCL 5 MG PO TABS
ORAL_TABLET | ORAL | 0 refills | Status: DC
Start: 2017-07-24 — End: 2019-03-30

## 2017-07-24 MED ORDER — IBUPROFEN 800 MG PO TABS
800.0000 mg | ORAL_TABLET | Freq: Once | ORAL | Status: AC
  Administered 2017-07-24: 800 mg via ORAL
  Filled 2017-07-24: qty 1

## 2017-07-24 NOTE — ED Triage Notes (Signed)
Patient ambulatory to triage with steady gait, without difficulty or distress noted; pt reports last several days having lower back pain, nonradiating with no accomp symptoms; st pain seemed to begin after doing stretching exercises

## 2017-07-24 NOTE — ED Provider Notes (Signed)
[facility] Emergency Department Provider Note   ____________________________________________   First MD Initiated Contact with Patient 07/24/17 [phone]     (approximate)  I have reviewed the triage vital signs and the nursing notes.   HISTORY  Chief Complaint Back Pain    HPI Martin Valentine is a 27 y.o. male who presents to the ED from home with a chief complaint of nontraumatic lower back pain.  Patient reports lower back pain mainly to his left side for the past 2 days.  Lifts heavy objects at work.  States he stretches before lifting and thinks he may have over stretched.  Denies associated radiation to the legs, extremity weakness, numbness/tingling, bowel or bladder incontinence.  Denies recent fever, chills, chest pain, shortness of breath, abdominal pain, nausea, vomiting, dysuria.  Denies recent travel or trauma.   Past medical history None   There are no active problems to display for this patient.   History reviewed. No pertinent surgical history.  Prior to Admission medications   Medication Sig Start Date End Date Taking? Authorizing Provider  cyclobenzaprine (FLEXERIL) 5 MG tablet 1 tablet every 8 hours as he did for muscle spasms 07/24/17   Irean Hong, MD  HYDROcodone-acetaminophen (NORCO) 5-325 MG tablet Take 1 tablet by mouth every 6 (six) hours as needed for moderate pain. 07/24/17   Irean Hong, MD  ibuprofen (ADVIL,MOTRIN) 800 MG tablet Take 1 tablet (800 mg total) by mouth every 8 (eight) hours as needed for moderate pain. 07/24/17   Irean Hong, MD  meloxicam (MOBIC) 15 MG tablet Take 1 tablet (15 mg total) by mouth daily. 04/09/17   Cuthriell, Delorise Royals, PA-C  naproxen sodium (ANAPROX) 275 MG tablet Take 1 tablet (275 mg total) by mouth 2 (two) times daily with a meal. 09/10/16 09/10/17  Joni Reining, PA-C  ondansetron (ZOFRAN ODT) 4 MG disintegrating tablet Take 1 tablet (4 mg total) by mouth every 8 (eight) hours as needed for  nausea or vomiting. 07/03/17   Emily Filbert, MD  oxyCODONE-acetaminophen (ROXICET) 5-325 MG tablet Take 1 tablet by mouth every 6 (six) hours as needed for moderate pain. 09/10/16   Joni Reining, PA-C    Allergies Patient has no known allergies.  No family history on file.  Social History Social History   Tobacco Use  . Smoking status: Current Some Day Smoker    Packs/day: 0.50    Types: Cigarettes  . Smokeless tobacco: Never Used  Substance Use Topics  . Alcohol use: No  . Drug use: No    Review of Systems  Constitutional: No fever/chills. Eyes: No visual changes. ENT: No sore throat. Cardiovascular: Denies chest pain. Respiratory: Denies shortness of breath. Gastrointestinal: No abdominal pain.  No nausea, no vomiting.  No diarrhea.  No constipation. Genitourinary: Negative for dysuria. Musculoskeletal: Positive for back pain. Skin: Negative for rash. Neurological: Negative for headaches, focal weakness or numbness.   ____________________________________________   PHYSICAL EXAM:  VITAL SIGNS: ED Triage Vitals [07/24/17 0155]  Enc Vitals Group     BP (!) 169/70     Pulse Rate (!) 109     Resp 20     Temp 97.7 F (36.5 C)     Temp Source Oral     SpO2 100 %     Weight 225 lb (102.1 kg)     Height 6' (1.829 m)     Head Circumference      Peak Flow  Pain Score 5     Pain Loc      Pain Edu?      Excl. in GC?     Constitutional: Alert and oriented. Well appearing and in no acute distress. Eyes: Conjunctivae are normal. PERRL. EOMI. Head: Atraumatic. Nose: No congestion/rhinnorhea. Mouth/Throat: Mucous membranes are moist.  Oropharynx non-erythematous. Neck: No stridor.  No cervical spine tenderness to palpation. Cardiovascular: Normal rate, regular rhythm. Grossly normal heart sounds.  Good peripheral circulation. Respiratory: Normal respiratory effort.  No retractions. Lungs CTAB. Gastrointestinal: Soft and nontender. No distention. No  abdominal bruits. No CVA tenderness. Musculoskeletal: No spinal tenderness to palpation.  Lumbar paraspinal muscle spasms, left greater than right.  Negative straight leg raise bilaterally.  No lower extremity tenderness nor edema.  No joint effusions. Neurologic:  Normal speech and language. No gross focal neurologic deficits are appreciated. No gait instability. Skin:  Skin is warm, dry and intact. No rash noted. Psychiatric: Mood and affect are normal. Speech and behavior are normal.  ____________________________________________   LABS (all labs ordered are listed, but only abnormal results are displayed)  Labs Reviewed - No data to display ____________________________________________  EKG  None ____________________________________________  RADIOLOGY  ED MD interpretation: None  Official radiology report(s): No results found.  ____________________________________________   PROCEDURES  Procedure(s) performed: None  Procedures  Critical Care performed: No  ____________________________________________   INITIAL IMPRESSION / ASSESSMENT AND PLAN / ED COURSE  As part of my medical decision making, I reviewed the following data within the electronic MEDICAL RECORD NUMBER History obtained from family, Nursing notes reviewed and incorporated, Old chart reviewed and Notes from prior ED visits   27 year old male who presents with nontraumatic lower back strain.  Will treat with NSAIDs, analgesia, muscle relaxer and patient will follow-up with orthopedics as needed.  Strict return precautions given.  Patient and family member verbalize understanding and agree with plan of care.      ____________________________________________   FINAL CLINICAL IMPRESSION(S) / ED DIAGNOSES  Final diagnoses:  Muscle strain  Muscle spasm of back     ED Discharge Orders        Ordered    ibuprofen (ADVIL,MOTRIN) 800 MG tablet  Every 8 hours PRN     07/24/17 0235     HYDROcodone-acetaminophen (NORCO) 5-325 MG tablet  Every 6 hours PRN     07/24/17 0235    cyclobenzaprine (FLEXERIL) 5 MG tablet     07/24/17 0235       Note:  This document was prepared using Dragon voice recognition software and may include unintentional dictation errors.    Irean HongSung, Hasna Stefanik J, MD 07/24/17 [phone]

## 2017-07-24 NOTE — Discharge Instructions (Signed)
1.  You may take medicines as needed for pain and muscle spasms (Motrin/Norco/Flexeril #15). 2.  Apply moist heat to affected area several times daily. 3.  Return to the ER for worsening symptoms, persistent vomiting, difficulty breathing, extremity weakness, losing control of your bowel or bladder, or other concerns.

## 2017-08-18 ENCOUNTER — Encounter: Payer: Self-pay | Admitting: Emergency Medicine

## 2017-08-18 ENCOUNTER — Emergency Department
Admission: EM | Admit: 2017-08-18 | Discharge: 2017-08-18 | Disposition: A | Discharge status: Home / Self Care | Payer: Self-pay | Attending: Emergency Medicine | Admitting: Emergency Medicine

## 2017-08-18 ENCOUNTER — Other Ambulatory Visit: Payer: Self-pay

## 2017-08-18 DIAGNOSIS — K0889 Other specified disorders of teeth and supporting structures: Secondary | ICD-10-CM | POA: Insufficient documentation

## 2017-08-18 DIAGNOSIS — Z79899 Other long term (current) drug therapy: Secondary | ICD-10-CM | POA: Insufficient documentation

## 2017-08-18 DIAGNOSIS — F17210 Nicotine dependence, cigarettes, uncomplicated: Secondary | ICD-10-CM | POA: Insufficient documentation

## 2017-08-18 MED ORDER — MELOXICAM 15 MG PO TABS: 15.0000 mg | ORAL_TABLET | Freq: Every day | ORAL | 1 refills | Status: AC

## 2017-08-18 MED ORDER — AMOXICILLIN 500 MG PO TABS: 500.0000 mg | ORAL_TABLET | Freq: Three times a day (TID) | ORAL | 0 refills | Status: AC

## 2017-08-18 NOTE — ED Triage Notes (Signed)
C/O left jaw dental pain/ abscess.  Started this morning.

## 2017-08-18 NOTE — Discharge Instructions (Signed)
OPTIONS FOR DENTAL FOLLOW UP CARE ° °[place] [facility] - Local Safety Net Dental Clinics °http://www.ncdhhs.gov/dph/oralhealth/services/safetynetclinics.htm °  °[facility] (336-562-3123) ° °[facility] [place] ([phone]) ° °[facility] ([phone] ext [phone]) ° ° [facility] ([phone]) ° °[facility] ([phone]) °This clinic caters to the indigent population and is on a lottery system. °Location: °[facility], [address], [place] °Clinic Hours: °Wednesdays from 6pm - 9pm, patients seen by a lottery system. °For dates, call or go to [email or web address]/[facility]/patients/[email or web address] °Services: °Cleanings, fillings and simple extractions. °Payment Options: °DENTAL WORK IS FREE OF CHARGE. Bring proof of income or support. °Best way to get seen: °Arrive at 5:15 pm - this is a lottery, NOT first come/first serve, so arriving earlier will not increase your chances of being seen. °  °  °[facility] °[phone] °Select option 1 for emergencies °  °Location: °[facility], [address], [place] °Clinic Hours: °No walk-ins accepted - call the day before to schedule an appointment. °Check in times are 9:30 am and 1:30 pm. °Services: °Simple extractions, temporary fillings, pulpectomy/pulp debridement, uncomplicated abscess drainage. °Payment Options: °PAYMENT IS DUE AT THE TIME OF SERVICE.  Fee is usually $100-200, additional surgical procedures (e.g. abscess drainage) may be extra. °Cash, checks, Visa/MasterCard accepted.  Can file Medicaid if patient is covered for dental - patient should call case worker to check. °No discount for [facility] patients. °Best way to get seen: °MUST call the day before and get onto the schedule. Can usually be seen the next 1-2 days. No walk-ins accepted. °  °  °[facility] °[phone] °   °Location: °[facility], [address], [place] °Clinic Hours: °M, W, Th, F 8am or 1:30pm, Tues 9a or 1:30 - first come/first served. °Services: °Simple extractions, temporary fillings, uncomplicated abscess drainage.  You do not need to be an [place] resident. °Payment Options: °PAYMENT IS DUE AT THE TIME OF SERVICE. °Dental insurance, otherwise sliding scale - bring proof of income or support. °Depending on income and treatment needed, cost is usually $50-200. °Best way to get seen: °Arrive early as it is first come/first served. °  °  °[facility] °[phone] °  °Location: °[address] °Clinic Hours: °Mon-Thu 8a-5p °Services: °Most basic dental services including extractions and fillings. °Payment Options: °PAYMENT IS DUE AT THE TIME OF SERVICE. °Sliding scale, up to 50% off - bring proof if income or support. °Medicaid with dental option accepted. °Best way to get seen: °Call to schedule an appointment, can usually be seen within 2 weeks OR they will try to see walk-ins - show up at 8a or 2p (you may have to wait). °  °  °[facility] °[phone] °[place] RESIDENTS ONLY °  °Location: °[facility], [address], [place], [place] [address] °Clinic Hours: By appointment only. °Monday - Thursday 8am-5pm, Friday 8am-12pm °Services: Cleanings, fillings, extractions. °Payment Options: °PAYMENT IS DUE AT THE TIME OF SERVICE. °Cash, Visa or MasterCard. Sliding scale - $30 minimum per service. °Best way to get seen: °Come in to office, complete packet and make an appointment - need proof of income °or support monies for each household member and proof of [place] residence. °Usually takes about a month to get in. °  °  °[facility] °[phone] °  °Location: °[address]   [place] °Clinic Hours: Walk-in Urgent Care Dental Services are offered Monday-Friday  mornings only. °The numbers of emergencies accepted daily is limited to the number of °providers available. °Maximum 15 - Mondays, Wednesdays & Thursdays °Maximum 10 - Tuesdays & Fridays °Services: °You do not need to be a [place] resident to be seen for a dental emergency. °Emergencies are defined as pain, swelling, abnormal bleeding, or dental trauma. Walkins will receive x-rays if needed. °NOTE: Dental cleaning is not an emergency. °Payment Options: °PAYMENT IS DUE AT THE TIME OF SERVICE. °Minimum co-pay is $40.00 for uninsured patients. °Minimum co-pay is $3.00 for Medicaid with dental coverage. °Dental Insurance is accepted and must be presented at time of visit. °Medicare does not cover dental. °Forms of payment: Cash, credit card, checks. °Best way to get seen: °If not previously registered with the clinic, walk-in dental registration begins at 7:15 am and is on a first come/first serve basis. °If previously registered with the clinic, call to make an appointment. °  °  °[facility] °[phone] °[place] RESIDENTS ONLY °  °Location: °[address], [place], [place] °Clinic Hours: °Mon-Thu 10a-2p °Services: Extractions only! °Payment Options: °FREE (donations accepted) - bring proof of income or support °Best way to get seen: °Call and schedule an appointment OR come at 8am on the 1st Monday of every month (except for holidays) when it is first come/first served. °  °  °[facility] °[phone] °  °Location: °[address], [place] °Clinic Hours: °Friday mornings °Services, Payment Options, Best way to get seen: °Call for info °

## 2017-08-18 NOTE — ED Provider Notes (Signed)
[facility] Emergency Department Provider Note  ____________________________________________  Time seen: Approximately 4:19 PM  I have reviewed the triage vital signs and the nursing notes.   HISTORY  Chief Complaint Dental Pain    HPI Martin Valentine is a 27 y.o. male presents to the emergency department with left lower jaw swelling that started this morning.  Patient has a broken inferior 18.  No discharge from the gumline.  No facial cellulitis.  No alleviating measures have been attempted.  Patient does not currently have an appointment with a local dentist.   History reviewed. No pertinent past medical history.  There are no active problems to display for this patient.   History reviewed. No pertinent surgical history.  Prior to Admission medications   Medication Sig Start Date End Date Taking? Authorizing Provider  amoxicillin (AMOXIL) 500 MG tablet Take 1 tablet (500 mg total) by mouth 3 (three) times daily for 10 days. 08/18/17 08/28/17  Orvil FeilWoods, Braycen Burandt M, PA-C  cyclobenzaprine (FLEXERIL) 5 MG tablet 1 tablet every 8 hours as he did for muscle spasms 07/24/17   Irean HongSung, Jade J, MD  HYDROcodone-acetaminophen (NORCO) 5-325 MG tablet Take 1 tablet by mouth every 6 (six) hours as needed for moderate pain. 07/24/17   Irean HongSung, Jade J, MD  ibuprofen (ADVIL,MOTRIN) 800 MG tablet Take 1 tablet (800 mg total) by mouth every 8 (eight) hours as needed for moderate pain. 07/24/17   Irean HongSung, Jade J, MD  meloxicam (MOBIC) 15 MG tablet Take 1 tablet (15 mg total) by mouth daily for 7 days. 08/18/17 08/25/17  Orvil FeilWoods, Yoali Conry M, PA-C  naproxen sodium (ANAPROX) 275 MG tablet Take 1 tablet (275 mg total) by mouth 2 (two) times daily with a meal. 09/10/16 09/10/17  Joni ReiningSmith, Ronald K, PA-C  ondansetron (ZOFRAN ODT) 4 MG disintegrating tablet Take 1 tablet (4 mg total) by mouth every 8 (eight) hours as needed for nausea or vomiting. 07/03/17   Emily FilbertWilliams, Jonathan E, MD  oxyCODONE-acetaminophen  (ROXICET) 5-325 MG tablet Take 1 tablet by mouth every 6 (six) hours as needed for moderate pain. 09/10/16   Joni ReiningSmith, Ronald K, PA-C    Allergies Patient has no known allergies.  No family history on file.  Social History Social History   Tobacco Use  . Smoking status: Current Some Day Smoker    Packs/day: 0.50    Types: Cigarettes  . Smokeless tobacco: Never Used  Substance Use Topics  . Alcohol use: No  . Drug use: No     Review of Systems  Constitutional: No fever/chills Eyes: No visual changes. No discharge ENT: Patient has Inferior 18 pain.  Cardiovascular: no chest pain. Respiratory: no cough. No SOB. Gastrointestinal: No abdominal pain.  No nausea, no vomiting.  No diarrhea.  No constipation. Musculoskeletal: Negative for musculoskeletal pain. Skin: Negative for rash, abrasions, lacerations, ecchymosis. Neurological: Negative for headaches, focal weakness or numbness. ____________________________________________   PHYSICAL EXAM:  VITAL SIGNS: ED Triage Vitals [08/18/17 1425]  Enc Vitals Group     BP 138/66     Pulse Rate 88     Resp 18     Temp 97.7 F (36.5 C)     Temp Source Oral     SpO2 98 %     Weight 225 lb (102.1 kg)     Height 6' (1.829 m)     Head Circumference      Peak Flow      Pain Score 8     Pain Loc  Pain Edu?      Excl. in GC?      Constitutional: Alert and oriented. Well appearing and in no acute distress. Eyes: Conjunctivae are normal. PERRL. EOMI. Head: Atraumatic.      Nose: No congestion/rhinnorhea.      Mouth/Throat: Mucous membranes are moist.  Patient has broken inferior 18 with associated left lower jaw edema. Hematological/Lymphatic/Immunilogical: No cervical lymphadenopathy. Cardiovascular: Normal rate, regular rhythm. Normal S1 and S2.  Good peripheral circulation. Respiratory: Normal respiratory effort without tachypnea or retractions. Lungs CTAB. Good air entry to the bases with no decreased or absent breath  sounds. Gastrointestinal: Bowel sounds 4 quadrants. Soft and nontender to palpation. No guarding or rigidity. No palpable masses. No distention. No CVA tenderness. Musculoskeletal: Full range of motion to all extremities. No gross deformities appreciated. Neurologic:  Normal speech and language. No gross focal neurologic deficits are appreciated.  Skin:  Skin is warm, dry and intact. No rash noted.   ____________________________________________   LABS (all labs ordered are listed, but only abnormal results are displayed)  Labs Reviewed - No data to display ____________________________________________  EKG   ____________________________________________  RADIOLOGY   No results found.  ____________________________________________    PROCEDURES  Procedure(s) performed:    Procedures    Medications - No data to display   ____________________________________________   INITIAL IMPRESSION / ASSESSMENT AND PLAN / ED COURSE  Pertinent labs & imaging results that were available during my care of the patient were reviewed by me and considered in my medical decision making (see chart for details).  Review of the [place] CSRS was performed in accordance of the [facility] prior to dispensing any controlled drugs.     Assessment and plan Dental pain Patient presents to the emergency department with a broken inferior 18 and left lower jaw swelling.  Patient was discharged with amoxicillin and was advised to make an appointment with a local dentist as soon as possible.  Vital signs are reassuring prior to discharge.  All patient questions were answered.    ____________________________________________  FINAL CLINICAL IMPRESSION(S) / ED DIAGNOSES  Final diagnoses:  Pain, dental      NEW MEDICATIONS STARTED DURING THIS VISIT:  ED Discharge Orders        Ordered    amoxicillin (AMOXIL) 500 MG tablet  3 times daily     08/18/17 1553    meloxicam (MOBIC) 15 MG tablet  Daily      08/18/17 1555          This chart was dictated using voice recognition software/Dragon. Despite best efforts to proofread, errors can occur which can change the meaning. Any change was purely unintentional.    Orvil Feil, PA-C 08/18/17 1622    Merrily Brittle, MD 08/18/17 [phone]

## 2017-08-18 NOTE — ED Notes (Signed)
Pt reports left lower tooth pain "abscess" that started this am - c/o left jaw swollen with a knot - denies drainage

## 2018-03-16 LAB — HIV ANTIBODY (ROUTINE TESTING W REFLEX): HIV 1&2 Ab, 4th Generation: NONREACTIVE

## 2018-03-16 LAB — HM HIV SCREENING LAB: HM HIV Screening: NEGATIVE

## 2019-03-30 ENCOUNTER — Other Ambulatory Visit: Payer: Self-pay

## 2019-03-30 ENCOUNTER — Emergency Department
Admission: EM | Admit: 2019-03-30 | Discharge: 2019-03-30 | Disposition: A | Discharge status: Home / Self Care | Payer: Self-pay | Attending: Emergency Medicine | Admitting: Emergency Medicine

## 2019-03-30 ENCOUNTER — Encounter: Payer: Self-pay | Admitting: Emergency Medicine

## 2019-03-30 DIAGNOSIS — G5603 Carpal tunnel syndrome, bilateral upper limbs: Secondary | ICD-10-CM | POA: Insufficient documentation

## 2019-03-30 DIAGNOSIS — F17210 Nicotine dependence, cigarettes, uncomplicated: Secondary | ICD-10-CM | POA: Insufficient documentation

## 2019-03-30 MED ORDER — NAPROXEN 500 MG PO TABS: 500.0000 mg | ORAL_TABLET | Freq: Two times a day (BID) | ORAL | 0 refills | Status: DC

## 2019-03-30 NOTE — ED Triage Notes (Signed)
Presents with numbness to both palms  States now the numbness is gone but still having pain  sxs' started last weds

## 2019-03-30 NOTE — ED Provider Notes (Signed)
[facility] Emergency Department Provider Note   ____________________________________________   First MD Initiated Contact with Patient 03/30/19 [phone]     (approximate)  I have reviewed the triage vital signs and the nursing notes.   HISTORY  Chief Complaint Hand Pain   HPI Martin Valentine is a 28 y.o. male presents to the ED with complaint of numbness to his hands bilaterally.  Patient states that he is right-hand dominant and denies any injury.  Patient states that he does a lot of repetitive motion.  He is also been working on his car and has had symptoms for approximately 1 week.  Patient has not taken any over-the-counter medication.  He states that the first 3 digits of his hands bilaterally feel numb and he is beginning to drop things.  He rates his pain as 5/10.       History reviewed. No pertinent past medical history.  There are no active problems to display for this patient.   History reviewed. No pertinent surgical history.  Prior to Admission medications   Medication Sig Start Date End Date Taking? Authorizing Provider  naproxen (NAPROSYN) 500 MG tablet Take 1 tablet (500 mg total) by mouth 2 (two) times daily with a meal. 03/30/19   Tommi Rumps, PA-C    Allergies Pollen extract  No family history on file.  Social History Social History   Tobacco Use  . Smoking status: Current Some Day Smoker    Packs/day: 0.50    Types: Cigarettes  . Smokeless tobacco: Never Used  Substance Use Topics  . Alcohol use: No  . Drug use: No    Review of Systems Constitutional: No fever/chills Cardiovascular: Denies chest pain. Respiratory: Denies shortness of breath. Musculoskeletal: Bilateral hand pain/numbness. Skin: Negative for rash. Neurological: Negative for headaches.  Positive for bilateral hand numbness. ____________________________________________   PHYSICAL EXAM:  VITAL SIGNS: ED Triage Vitals [03/30/19 0914]  Enc  Vitals Group     BP (!) 141/81     Pulse Rate 69     Resp 18     Temp 98.6 F (37 C)     Temp Source Oral     SpO2 98 %     Weight 262 lb (118.8 kg)     Height 6\' 1"  (1.854 m)     Head Circumference      Peak Flow      Pain Score 5     Pain Loc      Pain Edu?      Excl. in GC?    Constitutional: Alert and oriented. Well appearing and in no acute distress. Eyes: Conjunctivae are normal.  Head: Atraumatic. Neck: No stridor.   Cardiovascular: Normal rate, regular rhythm. Grossly normal heart sounds.  Good peripheral circulation. Respiratory: Normal respiratory effort.  No retractions. Lungs CTAB. Musculoskeletal: Examination of the hands bilaterally there is no gross deformity and no soft tissue edema or discoloration noted.  Grip strength is slightly decreased on the left in comparison with the right.  Good muscle strength generally.  Pain is increased with flexion and extension.  Tinel's sign is slightly positive.  Capillary refill is less than 3 seconds. Neurologic:  Normal speech and language. No gross focal neurologic deficits are appreciated. No gait instability. Skin:  Skin is warm, dry and intact. Psychiatric: Mood and affect are normal. Speech and behavior are normal.  ____________________________________________   LABS (all labs ordered are listed, but only abnormal results are displayed)  Labs Reviewed -  No data to display  PROCEDURES  Procedure(s) performed (including Critical Care):  Procedures Cock-up wrist splints were applied bilaterally.  ____________________________________________   INITIAL IMPRESSION / ASSESSMENT AND PLAN / ED COURSE  As part of my medical decision making, I reviewed the following data within the electronic MEDICAL RECORD NUMBER Notes from prior ED visits and [place] Controlled Substance Database  28 year old male presents to the ED with complaint of bilateral hand numbness affecting the first 3 digits in each hand bilaterally for approximately  1 week.  Patient denies any injury.  He does repetitive work and states he is also been working on his car which has made things worse.  Physical exam was suspicious for carpal tunnel syndrome and patient was made aware of the symptoms and that an EMG would be necessary in order to diagnose this.  In the meantime he agrees to wear cock-up wrist splint and begin taking anti-inflammatories.  He was referred to Dr. Mack Guise who was the orthopedist on call today.  Patient was given a note stating that he could return to work but that he would need to wear the wrist splints for added support.  Patient will return if any severe worsening of his symptoms. ____________________________________________   FINAL CLINICAL IMPRESSION(S) / ED DIAGNOSES  Final diagnoses:  Carpal tunnel syndrome, bilateral     ED Discharge Orders         Ordered    naproxen (NAPROSYN) 500 MG tablet  2 times daily with meals     03/30/19 1102           Note:  This document was prepared using Dragon voice recognition software and may include unintentional dictation errors.    Johnn Hai, PA-C 03/30/19 1620    Blake Divine, MD 03/31/19 1323

## 2019-03-30 NOTE — Discharge Instructions (Signed)
Call make an appointment with Dr. Mack Guise who is the orthopedist on call if any continued problems or not improving.  Begin taking naproxen 500 mg twice daily with food.  Wear your cock-up wrist splints to work and you may need to wear them at night to prevent you from bending your wrist especially up underneath your pillow while you are sleeping.

## 2019-06-12 ENCOUNTER — Emergency Department
Admission: EM | Admit: 2019-06-12 | Discharge: 2019-06-12 | Disposition: A | Discharge status: Home / Self Care | Payer: 59 | Attending: Emergency Medicine | Admitting: Emergency Medicine

## 2019-06-12 ENCOUNTER — Encounter: Payer: Self-pay | Admitting: Emergency Medicine

## 2019-06-12 ENCOUNTER — Other Ambulatory Visit: Payer: Self-pay

## 2019-06-12 DIAGNOSIS — F17210 Nicotine dependence, cigarettes, uncomplicated: Secondary | ICD-10-CM | POA: Insufficient documentation

## 2019-06-12 DIAGNOSIS — Z79899 Other long term (current) drug therapy: Secondary | ICD-10-CM | POA: Diagnosis not present

## 2019-06-12 DIAGNOSIS — M7918 Myalgia, other site: Secondary | ICD-10-CM | POA: Insufficient documentation

## 2019-06-12 MED ORDER — CYCLOBENZAPRINE HCL 5 MG PO TABS
5.0000 mg | ORAL_TABLET | Freq: Three times a day (TID) | ORAL | 0 refills | Status: AC | PRN
Start: 2019-06-12 — End: 2019-06-15

## 2019-06-12 NOTE — Discharge Instructions (Addendum)
Your exam is normal at this time. Take the prescription muscle relaxant as needed. Take OTC Ibuprofen for pain and inflammation relief. Follow-up with [facility] for ongoing symptoms. You may apply ice and/or heat for additional muscle pain relief.
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
## 2019-11-30 ENCOUNTER — Ambulatory Visit: Payer: Self-pay | Admitting: Physician Assistant

## 2019-11-30 ENCOUNTER — Other Ambulatory Visit: Payer: Self-pay

## 2019-11-30 DIAGNOSIS — Z113 Encounter for screening for infections with a predominantly sexual mode of transmission: Secondary | ICD-10-CM

## 2019-11-30 DIAGNOSIS — Z299 Encounter for prophylactic measures, unspecified: Secondary | ICD-10-CM

## 2019-12-01 ENCOUNTER — Encounter: Payer: Self-pay | Admitting: Physician Assistant

## 2019-12-01 MED ORDER — AZITHROMYCIN 500 MG PO TABS
1000.0000 mg | ORAL_TABLET | Freq: Once | ORAL | Status: AC
  Administered 2019-12-01: 1000 mg via ORAL

## 2019-12-01 NOTE — Addendum Note (Signed)
Addended by: Sadie Haber on: 12/01/2019 02:00 PM   Modules accepted: Orders

## 2019-12-01 NOTE — Progress Notes (Signed)
   [facility] Department STI clinic/screening visit  Subjective:  Martin Valentine is a 29 y.o. male being seen today for an STI screening visit. The patient reports they do have symptoms.    Patient has the following medical conditions:  There are no problems to display for this patient.    Chief Complaint  Patient presents with  . SEXUALLY TRANSMITTED DISEASE    screening    HPI  Patient reports that he was seen at [facility] and tested positive for Chlamydia.  Reports that he took his Doxycycline for 3 days and his symptoms cleared.  Reports that he forgot to take the medicine after that and had unprotected sex with an untreated partner and started having symptoms again.  States that his partner has been treated now and he wants to be re-treated.     See flowsheet for further details and programmatic requirements.    The following portions of the patient's history were reviewed and updated as appropriate: allergies, current medications, past medical history, past social history, past surgical history and problem list.  Objective:  There were no vitals filed for this visit.  Physical Exam Constitutional:      General: He is not in acute distress.    Appearance: Normal appearance.  HENT:     Head: Normocephalic and atraumatic.  Eyes:     Conjunctiva/sclera: Conjunctivae normal.  Pulmonary:     Effort: Pulmonary effort is normal.  Neurological:     Mental Status: He is alert and oriented to person, place, and time.  Psychiatric:        Mood and Affect: Mood normal.        Behavior: Behavior normal.        Thought Content: Thought content normal.        Judgment: Judgment normal.       Assessment and Plan:  Martin Valentine is a 29 y.o. male presenting to the [facility] Department for STI screening  1. Screening for STD (sexually transmitted disease) Patient into clinic with symptoms. Declines screening exam but accepts blood work today. Rec  condoms with all sex.  2. Prophylactic measure Will re-treat for Chlamydia with Azithromycin 1 g po DOT today. No sex for 7 days and until after partner completes treatment. RTC for re-treatment if vomits < 2 hr after taking medicine. - azithromycin (ZITHROMAX) tablet 1,000 mg     No follow-ups on file.  No future appointments.  Matt Holmes, PA

## 2019-12-07 ENCOUNTER — Emergency Department
Admission: EM | Admit: 2019-12-07 | Discharge: 2019-12-07 | Disposition: A | Discharge status: Home / Self Care | Payer: 59 | Attending: Emergency Medicine | Admitting: Emergency Medicine

## 2019-12-07 ENCOUNTER — Encounter: Payer: Self-pay | Admitting: Emergency Medicine

## 2019-12-07 ENCOUNTER — Other Ambulatory Visit: Payer: Self-pay

## 2019-12-07 ENCOUNTER — Emergency Department: Payer: 59

## 2019-12-07 DIAGNOSIS — K5900 Constipation, unspecified: Secondary | ICD-10-CM | POA: Diagnosis not present

## 2019-12-07 DIAGNOSIS — R1032 Left lower quadrant pain: Secondary | ICD-10-CM | POA: Diagnosis present

## 2019-12-07 DIAGNOSIS — F17210 Nicotine dependence, cigarettes, uncomplicated: Secondary | ICD-10-CM | POA: Diagnosis not present

## 2019-12-07 LAB — CBC
HCT: 42.6 % (ref 39.0–52.0)
Hemoglobin: 14.8 g/dL (ref 13.0–17.0)
MCH: 32.2 pg (ref 26.0–34.0)
MCHC: 34.7 g/dL (ref 30.0–36.0)
MCV: 92.8 fL (ref 80.0–100.0)
Platelets: 206 10*3/uL (ref 150–400)
RBC: 4.59 MIL/uL (ref 4.22–5.81)
RDW: 12.6 % (ref 11.5–15.5)
WBC: 7.1 10*3/uL (ref 4.0–10.5)
nRBC: 0 % (ref 0.0–0.2)

## 2019-12-07 LAB — URINALYSIS, COMPLETE (UACMP) WITH MICROSCOPIC
Bacteria, UA: NONE SEEN
Bilirubin Urine: NEGATIVE
Glucose, UA: NEGATIVE mg/dL
Hgb urine dipstick: NEGATIVE
Ketones, ur: NEGATIVE mg/dL
Leukocytes,Ua: NEGATIVE
Nitrite: NEGATIVE
Protein, ur: NEGATIVE mg/dL
Specific Gravity, Urine: 1.027 (ref 1.005–1.030)
pH: 5 (ref 5.0–8.0)

## 2019-12-07 LAB — COMPREHENSIVE METABOLIC PANEL
ALT: 22 U/L (ref 0–44)
AST: 21 U/L (ref 15–41)
Albumin: 4.1 g/dL (ref 3.5–5.0)
Alkaline Phosphatase: 52 U/L (ref 38–126)
Anion gap: 8 (ref 5–15)
BUN: 12 mg/dL (ref 6–20)
CO2: 28 mmol/L (ref 22–32)
Calcium: 9 mg/dL (ref 8.9–10.3)
Chloride: 103 mmol/L (ref 98–111)
Creatinine, Ser: 1 mg/dL (ref 0.61–1.24)
GFR calc Af Amer: >60 mL/min (ref >60)
GFR calc non Af Amer: >60 mL/min (ref >60)
Glucose, Bld: 111 mg/dL — ABNORMAL HIGH (ref 70–99)
Potassium: 4.6 mmol/L (ref 3.5–5.1)
Sodium: 139 mmol/L (ref 135–145)
Total Bilirubin: 0.7 mg/dL (ref 0.3–1.2)
Total Protein: 7.2 g/dL (ref 6.5–8.1)

## 2019-12-07 LAB — LIPASE, BLOOD: Lipase: 34 U/L (ref 11–51)

## 2019-12-07 MED ORDER — IOHEXOL 9 MG/ML PO SOLN
1000.0000 mL | ORAL | Status: DC | PRN
  Administered 2019-12-07: 500 mL via ORAL
  Filled 2019-12-07 (×2): qty 1000

## 2019-12-07 MED ORDER — IOHEXOL 300 MG/ML  SOLN
100.0000 mL | Freq: Once | INTRAMUSCULAR | Status: AC | PRN
  Administered 2019-12-07: 100 mL via INTRAVENOUS
  Filled 2019-12-07: qty 100

## 2019-12-07 MED ORDER — SODIUM CHLORIDE 0.9% FLUSH: 3.0000 mL | Freq: Once | INTRAVENOUS | Status: DC

## 2019-12-07 MED ORDER — POLYETHYLENE GLYCOL 3350 17 G PO PACK: 17.0000 g | PACK | Freq: Every day | ORAL | 0 refills | Status: AC

## 2019-12-07 NOTE — ED Notes (Signed)
See triage note  Presents with some abd cramping  States he developed some mid abd discomfort about 3-4 days ago  With some nausea   Now pain is mainly to left side  Vomited times 1   No fever

## 2019-12-07 NOTE — ED Notes (Signed)
Back from CT

## 2019-12-07 NOTE — ED Provider Notes (Signed)
[facility] Emergency Department Provider Note  ____________________________________________  Time seen: Approximately 7:49 AM  I have reviewed the triage vital signs and the nursing notes.   HISTORY  Chief Complaint Abdominal Pain    HPI Martin Valentine is a 29 y.o. male that presents to the emergency department for evaluation of left lower quadrant pain since yesterday. Pain is intermittent. Patient did vomit once this morning.  He had a smaller than usual bowel movement this morning.  Bowel movement was normal yesterday. He has been passing more gas this morning. No fever, shortness of breath, chest pain, urinary symptoms, diarrhea, constipation.   History reviewed. No pertinent past medical history.  There are no problems to display for this patient.   History reviewed. No pertinent surgical history.  Prior to Admission medications   Medication Sig Start Date End Date Taking? Authorizing Provider  polyethylene glycol (MIRALAX) 17 g packet Take 17 g by mouth daily. 12/07/19   Enid Derry, PA-C    Allergies Pollen extract  No family history on file.  Social History Social History   Tobacco Use  . Smoking status: Current Some Day Smoker    Packs/day: 0.50    Types: Cigarettes  . Smokeless tobacco: Never Used  Vaping Use  . Vaping Use: Some days  Substance Use Topics  . Alcohol use: No  . Drug use: No     Review of Systems  Constitutional: No fevers. Cardiovascular: No chest pain. Respiratory: No SOB. Gastrointestinal: Positive for abdominal pain.  No nausea, no vomiting.  Musculoskeletal: Negative for musculoskeletal pain. Skin: Negative for rash, abrasions, lacerations, ecchymosis. Neurological: Negative for headaches, numbness or tingling   ____________________________________________   PHYSICAL EXAM:  VITAL SIGNS: ED Triage Vitals  Enc Vitals Group     BP 12/07/19 0524 (!) 149/75     Pulse Rate 12/07/19 0524 64      Resp 12/07/19 0524 18     Temp 12/07/19 0524 98 F (36.7 C)     Temp src --      SpO2 12/07/19 0524 100 %     Weight 12/07/19 0525 242 lb (109.8 kg)     Height 12/07/19 0525 6\' 1"  (1.854 m)     Head Circumference --      Peak Flow --      Pain Score 12/07/19 0524 9     Pain Loc --      Pain Edu? --      Excl. in [place]? --      Constitutional: Alert and oriented. Well appearing and in no acute distress. Eyes: Conjunctivae are normal. PERRL. EOMI. Head: Atraumatic. ENT:      Ears:      Nose: No congestion/rhinnorhea.      Mouth/Throat: Mucous membranes are moist.  Neck: No stridor. Cardiovascular: Normal rate, regular rhythm.  Good peripheral circulation. Respiratory: Normal respiratory effort without tachypnea or retractions. Lungs CTAB. Good air entry to the bases with no decreased or absent breath sounds. Gastrointestinal: Bowel sounds 4 quadrants.  Left lower quadrant tenderness to palpation. No guarding or rigidity. No palpable masses. No distention.  Musculoskeletal: Full range of motion to all extremities. No gross deformities appreciated. Neurologic:  Normal speech and language. No gross focal neurologic deficits are appreciated.  Skin:  Skin is warm, dry and intact. No rash noted. Psychiatric: Mood and affect are normal. Speech and behavior are normal. Patient exhibits appropriate insight and judgement.   ____________________________________________   LABS (all labs ordered are  listed, but only abnormal results are displayed)  Labs Reviewed  COMPREHENSIVE METABOLIC PANEL - Abnormal; Notable for the following components:      Result Value   Glucose, Bld 111 (*)    All other components within normal limits  URINALYSIS, COMPLETE (UACMP) WITH MICROSCOPIC - Abnormal; Notable for the following components:   Color, Urine YELLOW (*)    APPearance HAZY (*)    All other components within normal limits  LIPASE, BLOOD  CBC    ____________________________________________  EKG   ____________________________________________  RADIOLOGY   CT ABDOMEN PELVIS W CONTRAST  Result Date: 12/07/2019 CLINICAL DATA:  Three day history of left lower quadrant abdominal pain. EXAM: CT ABDOMEN AND PELVIS WITH CONTRAST TECHNIQUE: Multidetector CT imaging of the abdomen and pelvis was performed using the standard protocol following bolus administration of intravenous contrast. CONTRAST:  OMNIPAQUE IOHEXOL 300 MG/ML  SOLN COMPARISON:  None. FINDINGS: Lower chest: The lung bases are clear of acute process. No pleural effusion or pulmonary lesions. The heart is normal in size. No pericardial effusion. The distal esophagus and aorta are unremarkable. Hepatobiliary: No focal hepatic lesions or intrahepatic biliary dilatation. The gallbladder is normal. No common bile duct dilatation. Pancreas: No mass, inflammation or ductal dilatation. Spleen: Normal size. No focal lesions. Adrenals/Urinary Tract: The adrenal glands and kidneys are unremarkable. No renal lesions, renal or ureteral calculi. The bladder is unremarkable. Stomach/Bowel: The stomach, duodenum, small bowel and colon are unremarkable. No acute inflammatory changes, mass lesions or obstructive findings. The terminal ileum is normal. The appendix is normal. Moderate stool throughout the colon and down into the rectum could suggest constipation. Vascular/Lymphatic: The aorta is normal in caliber. No dissection. The branch vessels are patent. The major venous structures are patent. No mesenteric or retroperitoneal mass or adenopathy. Small scattered lymph nodes are noted. Reproductive: The prostate gland and seminal vesicles are unremarkable. Other: No pelvic mass or adenopathy. No free pelvic fluid collections. No inguinal mass or adenopathy. No abdominal wall hernia or subcutaneous lesions. Musculoskeletal: No significant bony findings. IMPRESSION: 1. No acute abdominal/pelvic  findings, mass lesions or adenopathy. 2. Moderate stool throughout the colon and down into the rectum could suggest constipation. Electronically Signed   By: Rudie Meyer M.D.   On: 12/07/2019 09:00    ____________________________________________    PROCEDURES  Procedure(s) performed:    Procedures    Medications  sodium chloride flush (NS) 0.9 % injection 3 mL (3 mLs Intravenous Not Given 12/07/19 0743)  iohexol (OMNIPAQUE) 9 MG/ML oral solution 1,000 mL (500 mLs Oral Contrast Given 12/07/19 0812)  iohexol (OMNIPAQUE) 300 MG/ML solution 100 mL (100 mLs Intravenous Contrast Given 12/07/19 0839)     ____________________________________________   INITIAL IMPRESSION / ASSESSMENT AND PLAN / ED COURSE  Pertinent labs & imaging results that were available during my care of the patient were reviewed by me and considered in my medical decision making (see chart for details).  Review of the [place] CSRS was performed in accordance of the [facility] prior to dispensing any controlled drugs.   Patient's diagnosis is consistent with consitpation. Labwork unremarkable. Vital signs and exam are reassuring. CT scan shows constipation without additional abnormality. Patient will be discharged home with prescriptions for miralax. Patient is to follow up with PCP as directed. Patient is given ED precautions to return to the ED for any worsening or new symptoms.  EVO ADERMAN was evaluated in Emergency Department on 12/07/2019 for the symptoms described in the history of present illness.  He was evaluated in the context of the global COVID-19 pandemic, which necessitated consideration that the patient might be at risk for infection with the SARS-CoV-2 virus that causes COVID-19. Institutional protocols and algorithms that pertain to the evaluation of patients at risk for COVID-19 are in a state of rapid change based on information released by regulatory bodies including the CDC and federal and state  organizations. These policies and algorithms were followed during the patient's care in the ED.   ____________________________________________  FINAL CLINICAL IMPRESSION(S) / ED DIAGNOSES  Final diagnoses:  Constipation, unspecified constipation type      NEW MEDICATIONS STARTED DURING THIS VISIT:  ED Discharge Orders         Ordered    polyethylene glycol (MIRALAX) 17 g packet  Daily     Discontinue  Reprint     12/07/19 0958              This chart was dictated using voice recognition software/Dragon. Despite best efforts to proofread, errors can occur which can change the meaning. Any change was purely unintentional.    Enid Derry, PA-C 12/07/19 1317    Jene Every, MD 12/07/19 1320

## 2019-12-07 NOTE — ED Triage Notes (Addendum)
Pt presents to ED with left lower cramping abd pain. Onset Monday morning.  Vomiting X1.  Painful with palpation. Pt reports having a small bowel movement prior to arrival but no other recent bowel movement.

## 2020-02-09 ENCOUNTER — Encounter: Payer: Self-pay | Admitting: Emergency Medicine

## 2020-02-09 ENCOUNTER — Other Ambulatory Visit: Payer: Self-pay

## 2020-02-09 ENCOUNTER — Emergency Department
Admission: EM | Admit: 2020-02-09 | Discharge: 2020-02-09 | Disposition: A | Discharge status: Home / Self Care | Payer: 59 | Attending: Emergency Medicine | Admitting: Emergency Medicine

## 2020-02-09 DIAGNOSIS — A084 Viral intestinal infection, unspecified: Secondary | ICD-10-CM

## 2020-02-09 DIAGNOSIS — F17210 Nicotine dependence, cigarettes, uncomplicated: Secondary | ICD-10-CM | POA: Insufficient documentation

## 2020-02-09 DIAGNOSIS — Z20822 Contact with and (suspected) exposure to covid-19: Secondary | ICD-10-CM | POA: Insufficient documentation

## 2020-02-09 DIAGNOSIS — Z79899 Other long term (current) drug therapy: Secondary | ICD-10-CM | POA: Insufficient documentation

## 2020-02-09 DIAGNOSIS — R112 Nausea with vomiting, unspecified: Secondary | ICD-10-CM | POA: Diagnosis present

## 2020-02-09 LAB — SARS CORONAVIRUS 2 BY RT PCR (HOSPITAL ORDER, PERFORMED IN ~~LOC~~ HOSPITAL LAB): SARS Coronavirus 2: NEGATIVE

## 2020-02-09 MED ORDER — ONDANSETRON 4 MG PO TBDP: 4.0000 mg | ORAL_TABLET | Freq: Three times a day (TID) | ORAL | 0 refills | Status: DC | PRN

## 2020-02-09 MED ORDER — ONDANSETRON 4 MG PO TBDP
4.0000 mg | ORAL_TABLET | Freq: Once | ORAL | Status: AC
  Administered 2020-02-09: 4 mg via ORAL
  Filled 2020-02-09: qty 1

## 2020-02-09 NOTE — ED Provider Notes (Signed)
[facility] Emergency Department Provider Note   ____________________________________________    I have reviewed the triage vital signs and the nursing notes.   HISTORY  Chief Complaint Nausea     HPI Martin Valentine is a 29 y.o. male who presents with complaints of nausea vomiting and diarrhea.  Patient reports he felt queasy this morning, at work had to throw up and shortly thereafter had multiple episodes of diarrhea.  Sent home from work because of having to go to the bathroom repeatedly.  Denies fevers or chills.  No unusual foods or undercooked meats.  No sick contacts reported.  He has not been vaccinated against COVID-19.  Denies abdominal pain.  Has not take anything for this.  No body aches.  History reviewed. No pertinent past medical history.  There are no problems to display for this patient.   History reviewed. No pertinent surgical history.  Prior to Admission medications   Medication Sig Start Date End Date Taking? Authorizing Provider  ondansetron (ZOFRAN ODT) 4 MG disintegrating tablet Take 1 tablet (4 mg total) by mouth every 8 (eight) hours as needed. 02/09/20   Jene Every, MD  polyethylene glycol (MIRALAX) 17 g packet Take 17 g by mouth daily. 12/07/19   Enid Derry, PA-C     Allergies Pollen extract  No family history on file.  Social History Social History   Tobacco Use  . Smoking status: Current Some Day Smoker    Packs/day: 0.50    Types: Cigarettes  . Smokeless tobacco: Never Used  Vaping Use  . Vaping Use: Some days  Substance Use Topics  . Alcohol use: No  . Drug use: No    Review of Systems  Constitutional: As above Eyes: No visual changes.  ENT: No sore throat. Cardiovascular: Denies chest pain. Respiratory: Denies shortness of breath. Gastrointestinal: As above Genitourinary: Negative for dysuria. Musculoskeletal: No myalgias Skin: Negative for rash. Neurological: Negative for headaches     ____________________________________________   PHYSICAL EXAM:  VITAL SIGNS: ED Triage Vitals  Enc Vitals Group     BP 02/09/20 1054 130/77     Pulse Rate 02/09/20 1054 64     Resp 02/09/20 1054 18     Temp 02/09/20 1054 98.3 F (36.8 C)     Temp Source 02/09/20 1054 Oral     SpO2 02/09/20 1054 98 %     Weight 02/09/20 0923 119.3 kg (263 lb)     Height 02/09/20 0923 1.854 m (6\' 1" )     Head Circumference --      Peak Flow --      Pain Score 02/09/20 0923 0     Pain Loc --      Pain Edu? --      Excl. in [place]? --     Constitutional: Alert and oriented. No acute distress.  Eyes: Conjunctivae are normal.  Nose: No congestion/rhinnorhea. Mouth/Throat: Mucous membranes are moist.   Cardiovascular: Normal rate, regular rhythm. Grossly normal heart sounds.  Good peripheral circulation. Respiratory: Normal respiratory effort.  No retractions. Lungs CTAB. Gastrointestinal: Soft and nontender. No distention.   Musculoskeletal:   Warm and well perfused Neurologic:  Normal speech and language. No gross focal neurologic deficits are appreciated.  Skin:  Skin is warm, dry and intact. No rash noted. Psychiatric: Mood and affect are normal. Speech and behavior are normal.  ____________________________________________   LABS (all labs ordered are listed, but only abnormal results are displayed)  Labs Reviewed  SARS CORONAVIRUS 2 BY RT PCR (HOSPITAL ORDER, PERFORMED IN [facility])   ____________________________________________  EKG  None ____________________________________________  RADIOLOGY  None ____________________________________________   PROCEDURES  Procedure(s) performed: No  Procedures   Critical Care performed: No ____________________________________________   INITIAL IMPRESSION / ASSESSMENT AND PLAN / ED COURSE  Pertinent labs & imaging results that were available during my care of the patient were reviewed by me and considered in my  medical decision making (see chart for details).  Patient presents with nausea vomiting diarrhea. No abdominal pain. Reassuring exam. Differential includes foodborne illness, viral gastroenteritis, possibility of COVID-19 as he is not vaccinated. Did counsel patient today received COVID-19 vaccination. Will send Covid test, treat symptomatically medication for labs at this time, recommend rest hydration outpatient follow-up.    ____________________________________________   FINAL CLINICAL IMPRESSION(S) / ED DIAGNOSES  Final diagnoses:  Viral gastroenteritis        Note:  This document was prepared using Dragon voice recognition software and may include unintentional dictation errors.   Jene Every, MD 02/09/20 1323

## 2020-02-09 NOTE — ED Triage Notes (Signed)
Pt reports woke up this am and went to work and felt nauseated so wanted to come and get checked out. Denies other sx's.

## 2021-07-20 ENCOUNTER — Emergency Department
Admission: EM | Admit: 2021-07-20 | Discharge: 2021-07-20 | Disposition: A | Discharge status: Home / Self Care | Payer: 59 | Attending: Emergency Medicine | Admitting: Emergency Medicine

## 2021-07-20 ENCOUNTER — Other Ambulatory Visit: Payer: Self-pay

## 2021-07-20 ENCOUNTER — Encounter: Payer: Self-pay | Admitting: Emergency Medicine

## 2021-07-20 DIAGNOSIS — M5432 Sciatica, left side: Secondary | ICD-10-CM | POA: Insufficient documentation

## 2021-07-20 MED ORDER — PREDNISONE 50 MG PO TABS: 50.0000 mg | ORAL_TABLET | Freq: Every day | ORAL | 0 refills | Status: AC

## 2021-07-20 MED ORDER — NAPROXEN 500 MG PO TABS: 500.0000 mg | ORAL_TABLET | Freq: Two times a day (BID) | ORAL | 2 refills | Status: AC

## 2021-07-20 NOTE — ED Notes (Signed)
See triage note   presents with lower back pain  states pain is moving into leg  ambulates well

## 2021-07-20 NOTE — ED Provider Notes (Signed)
[facility] Provider Note    Event Date/Time   First MD Initiated Contact with Patient 07/20/21 [phone]     (approximate)   History   Back Pain   HPI  Martin Valentine is a 31 y.o. male who presents with complaints of left lower back pain with some radiation to his left buttock.  Patient reports this is been ongoing since Tuesday.  He was sleeping on an air mattress which became deflated during the night and he woke up with soreness in his left lower back.  He reports the pain is worse with ambulation but he reports normal strength.  No saddle anesthesia, no loss of bowel or bladder.  No fevers.     Physical Exam   Triage Vital Signs: ED Triage Vitals  Enc Vitals Group     BP 07/20/21 0745 139/87     Pulse Rate 07/20/21 0745 88     Resp 07/20/21 0745 16     Temp 07/20/21 0745 98.3 F (36.8 C)     Temp Source 07/20/21 0745 Oral     SpO2 07/20/21 0745 98 %     Weight 07/20/21 0744 111.1 kg (245 lb)     Height 07/20/21 0744 1.854 m (6\' 1" )     Head Circumference --      Peak Flow --      Pain Score 07/20/21 0743 7     Pain Loc --      Pain Edu? --      Excl. in GC? --     Most recent vital signs: Vitals:   07/20/21 0745 07/20/21 0757  BP: 139/87 139/87  Pulse: 88 88  Resp: 16 16  Temp: 98.3 F (36.8 C) 98.3 F (36.8 C)  SpO2: 98% 98%     General: Awake, no distress.  CV:  Good peripheral perfusion.  Resp:  Normal effort.  Abd:  No distention.  Other:  Back: No vertebral tenderness to palpation, mild left lumbar paraspinal tenderness, normal strength in the lower extremities, warm and well-perfused distally, no abdominal pain   ED Results / Procedures / Treatments   Labs (all labs ordered are listed, but only abnormal results are displayed) Labs Reviewed - No data to display   EKG     RADIOLOGY     PROCEDURES:  Critical Care performed:   Procedures   MEDICATIONS ORDERED IN ED: Medications - No data to  display   IMPRESSION / MDM / ASSESSMENT AND PLAN / ED COURSE  I reviewed the triage vital signs and the nursing notes.  Patient presents with back pain as detailed above with some radiation to his left buttock, most consistent with sciatica/lumbar strain.  Reassuring exam, no red flags.  Vitals unremarkable.  Will treat with short course of prednisone and Naprosyn  Outpatient follow-up as needed          FINAL CLINICAL IMPRESSION(S) / ED DIAGNOSES   Final diagnoses:  Sciatica of left side     Rx / DC Orders   ED Discharge Orders          Ordered    predniSONE (DELTASONE) 50 MG tablet  Daily with breakfast        07/20/21 0751    naproxen (NAPROSYN) 500 MG tablet  2 times daily with meals        07/20/21 0751             Note:  This document was prepared using Dragon voice  recognition software and may include unintentional dictation errors.   Jene Every, MD 07/20/21 [phone]

## 2021-07-20 NOTE — ED Triage Notes (Signed)
Pt to ED via POV c/o lower back pain. Pt denies any known injury to the arm. Pt states that the pain does radiate into his buttocks and down the back of his leg. Pt is in NAD.

## 2022-04-04 IMAGING — CT CT ABD-PELV W/ CM
2 of 4 series · 16 of 46 positions shown, 18 images · IV contrast (APPLIED)
Comparison: None.

CLINICAL DATA: Three day history of left lower quadrant abdominal
pain.

EXAM:
CT ABDOMEN AND PELVIS WITH CONTRAST
TECHNIQUE: Multidetector CT imaging of the abdomen and pelvis was performed
using the standard protocol following bolus administration of
intravenous contrast.
CONTRAST:  100mL OMNIPAQUE IOHEXOL 300 MG/ML  SOLN

[Series 2: axial st · axial · 0.79mm/px · z∈[-923,-443]mm · 13 of 106 slices shown, 15 images]
[im 5/106  soft-tissue]
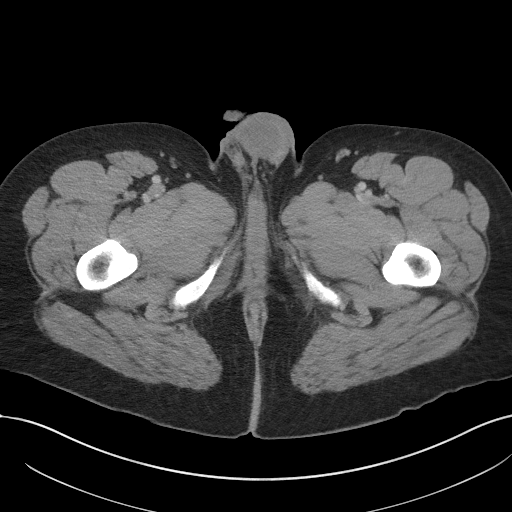
[im 5/106  bone]
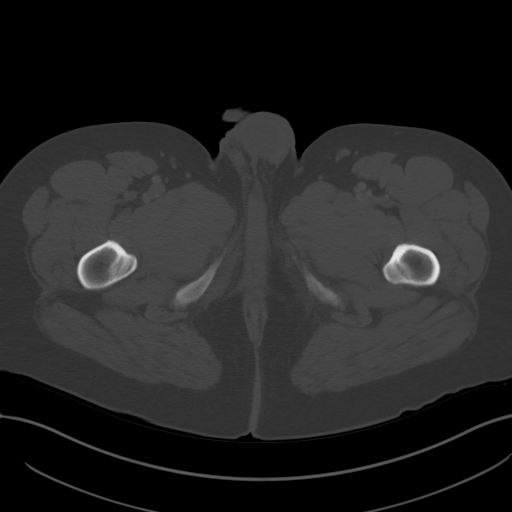
[im 13/106  soft-tissue]
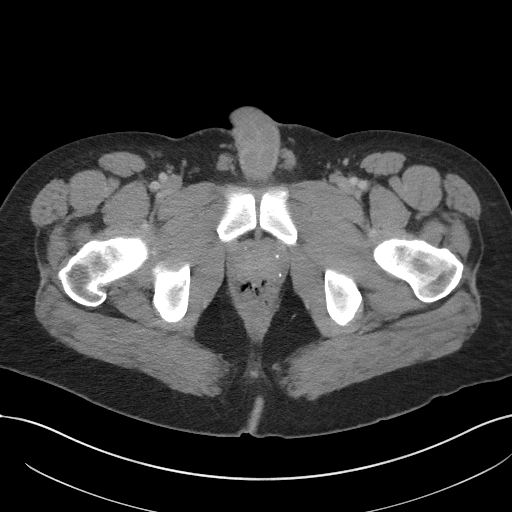
[im 22/106  soft-tissue]
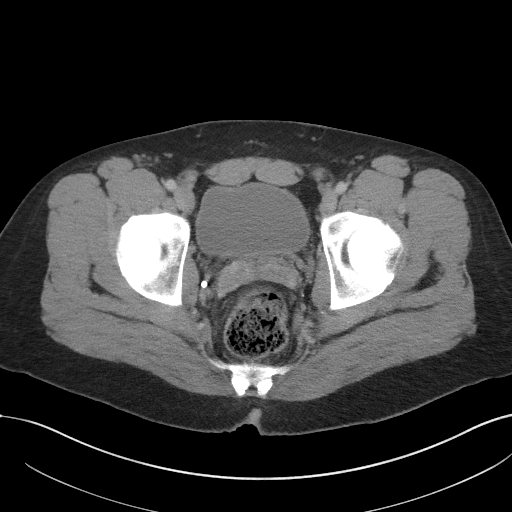
[im 30/106  soft-tissue]
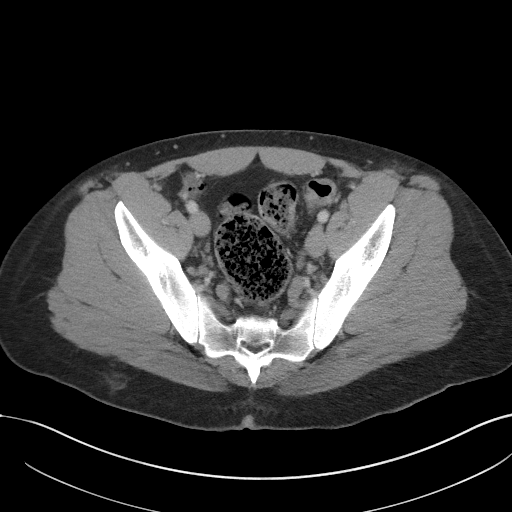
[im 38/106  soft-tissue]
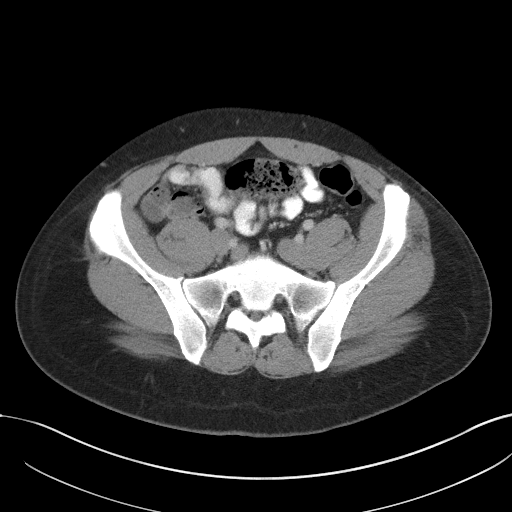
[im 47/106  soft-tissue]
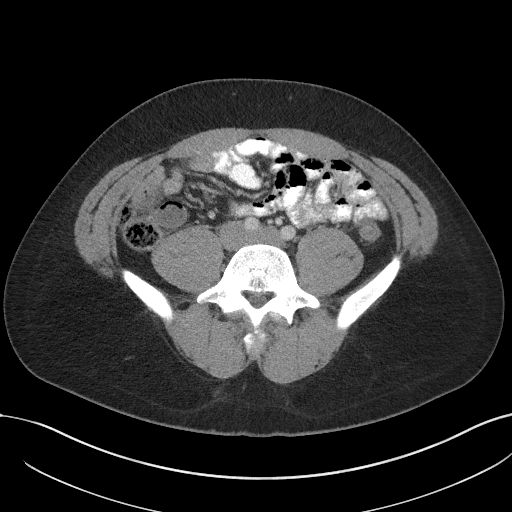
[im 55/106  soft-tissue]
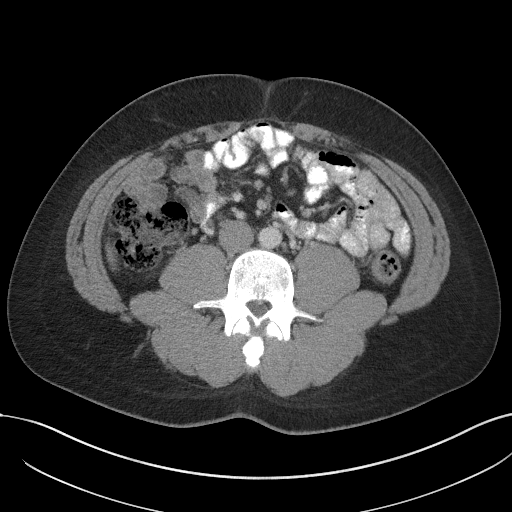
[im 59/106  soft-tissue]
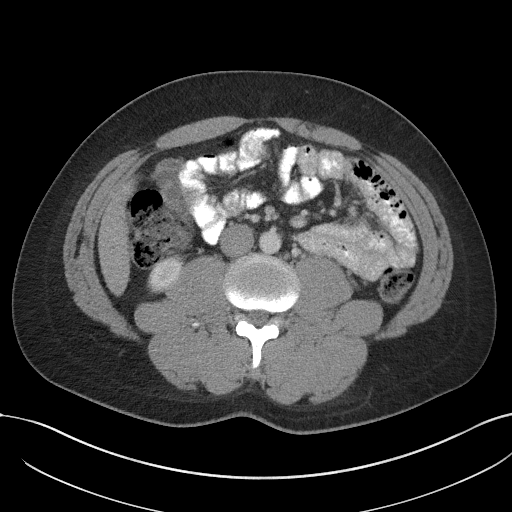
[im 68/106  soft-tissue]
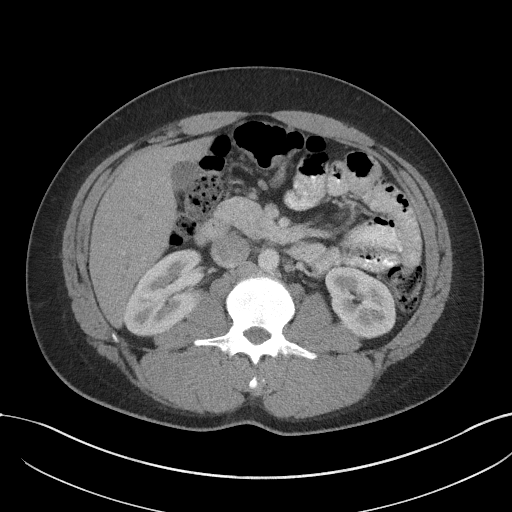
[im 68/106  bone]
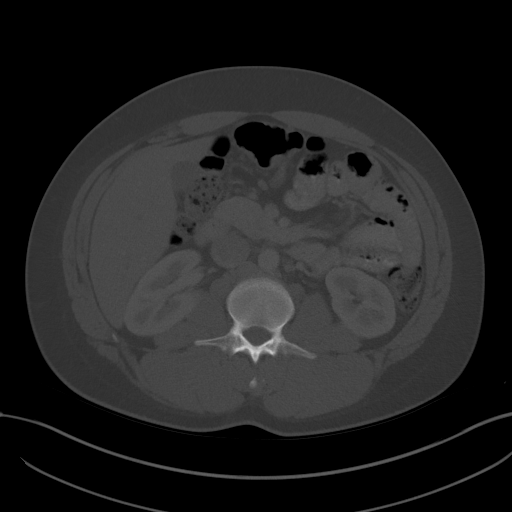
[im 76/106  soft-tissue]
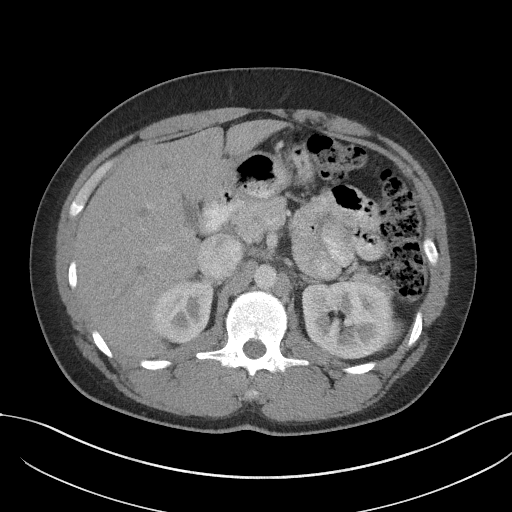
[im 85/106  soft-tissue]
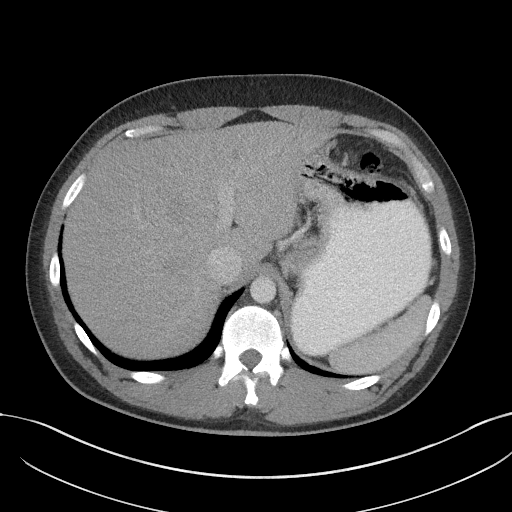
[im 93/106  soft-tissue]
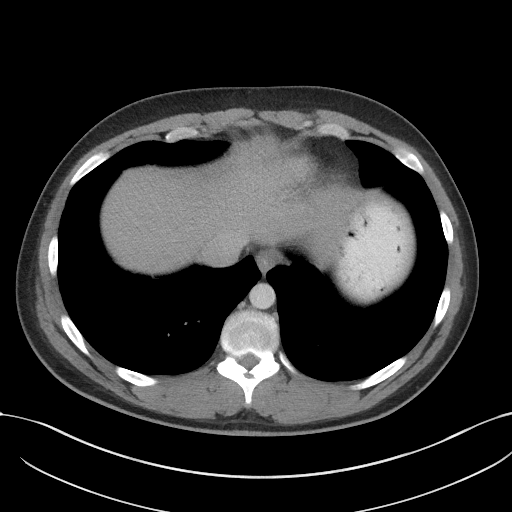
[im 101/106  soft-tissue]
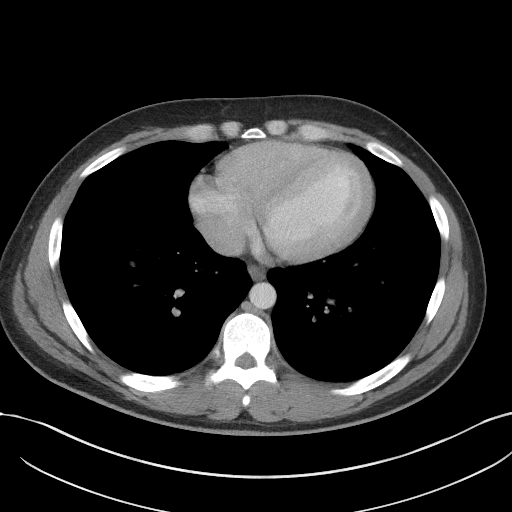

[Series 5: coronal st · coronal · 0.92mm/px · 3 of 99 slices shown]
[im 33/99  soft-tissue]
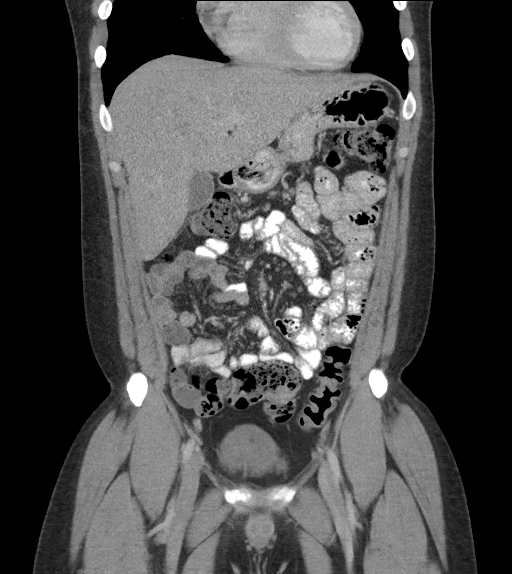
[im 44/99  soft-tissue]
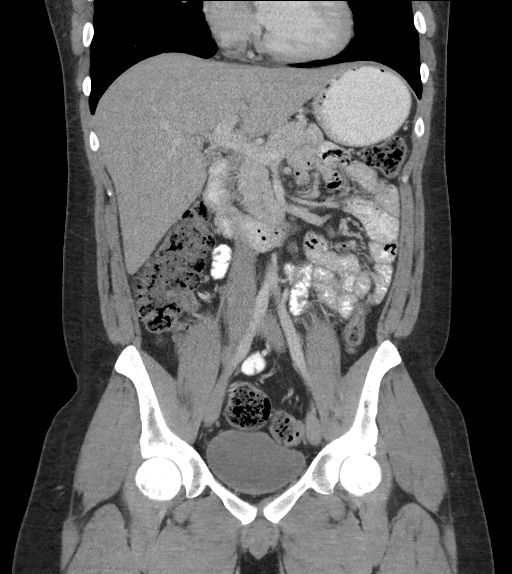
[im 55/99  soft-tissue]
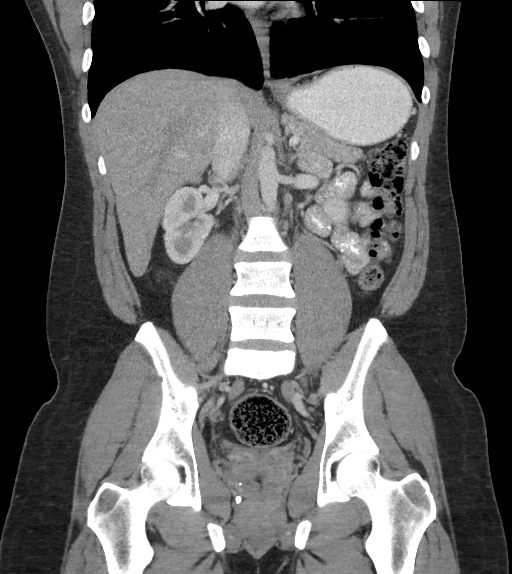

[16 of 46 positions shown; findings below may reference images not displayed]

FINDINGS: Lower chest: The lung bases are clear of acute process. No pleural
effusion or pulmonary lesions. The heart is normal in size. No
pericardial effusion. The distal esophagus and aorta are
unremarkable.

Hepatobiliary: No focal hepatic lesions or intrahepatic biliary
dilatation. The gallbladder is normal. No common bile duct
dilatation.

Pancreas: No mass, inflammation or ductal dilatation.

Spleen: Normal size. No focal lesions.

Adrenals/Urinary Tract: The adrenal glands and kidneys are
unremarkable. No renal lesions, renal or ureteral calculi. The
bladder is unremarkable.

Stomach/Bowel: The stomach, duodenum, small bowel and colon are
unremarkable. No acute inflammatory changes, mass lesions or
obstructive findings. The terminal ileum is normal. The appendix is
normal. Moderate stool throughout the colon and down into the rectum
could suggest constipation.

Vascular/Lymphatic: The aorta is normal in caliber. No dissection.
The branch vessels are patent. The major venous structures are
patent. No mesenteric or retroperitoneal mass or adenopathy. Small
scattered lymph nodes are noted.

Reproductive: The prostate gland and seminal vesicles are
unremarkable.

Other: No pelvic mass or adenopathy. No free pelvic fluid
collections. No inguinal mass or adenopathy. No abdominal wall
hernia or subcutaneous lesions.

Musculoskeletal: No significant bony findings.
IMPRESSION: 1. No acute abdominal/pelvic findings, mass lesions or adenopathy.
2. Moderate stool throughout the colon and down into the rectum
could suggest constipation.
